# Patient Record
Sex: Male | Born: 1975 | Race: White | Hispanic: No | Marital: Married | Smoking: Former smoker
Health system: Southern US, Community
[De-identification: ages and names within clinical notes are randomized; demographics above are authoritative.]

## PROBLEM LIST (undated history)

## (undated) DIAGNOSIS — D649 Anemia, unspecified: Secondary | ICD-10-CM

## (undated) DIAGNOSIS — K5790 Diverticulosis of intestine, part unspecified, without perforation or abscess without bleeding: Secondary | ICD-10-CM

## (undated) DIAGNOSIS — F419 Anxiety disorder, unspecified: Secondary | ICD-10-CM

## (undated) DIAGNOSIS — K5090 Crohn's disease, unspecified, without complications: Secondary | ICD-10-CM

## (undated) DIAGNOSIS — R001 Bradycardia, unspecified: Secondary | ICD-10-CM

## (undated) HISTORY — PX: FLEXIBLE SIGMOIDOSCOPY: SHX1649

## (undated) HISTORY — PX: COLONOSCOPY: SHX174

## (undated) HISTORY — DX: Diverticulosis of intestine, part unspecified, without perforation or abscess without bleeding: K57.90

## (undated) HISTORY — PX: OTHER SURGICAL HISTORY: SHX169

## (undated) HISTORY — PX: MOUTH SURGERY: SHX715

## (undated) HISTORY — DX: Bradycardia, unspecified: R00.1

## (undated) HISTORY — DX: Anxiety disorder, unspecified: F41.9

---

## 1998-02-24 ENCOUNTER — Emergency Department (HOSPITAL_COMMUNITY): Admission: EM | Admit: 1998-02-24 | Discharge: 1998-02-24 | Payer: Self-pay | Admitting: Emergency Medicine

## 2002-06-25 ENCOUNTER — Encounter: Payer: Self-pay | Admitting: Emergency Medicine

## 2002-06-25 ENCOUNTER — Emergency Department (HOSPITAL_COMMUNITY): Admission: EM | Admit: 2002-06-25 | Discharge: 2002-06-25 | Payer: Self-pay | Admitting: Emergency Medicine

## 2003-05-16 ENCOUNTER — Emergency Department (HOSPITAL_COMMUNITY): Admission: EM | Admit: 2003-05-16 | Discharge: 2003-05-16 | Payer: Self-pay | Admitting: Emergency Medicine

## 2007-09-01 ENCOUNTER — Inpatient Hospital Stay (HOSPITAL_COMMUNITY): Admission: AD | Admit: 2007-09-01 | Discharge: 2007-09-06 | Payer: Self-pay | Admitting: Gastroenterology

## 2007-09-01 ENCOUNTER — Encounter: Admission: RE | Admit: 2007-09-01 | Discharge: 2007-09-01 | Payer: Self-pay | Admitting: Gastroenterology

## 2007-09-02 ENCOUNTER — Encounter (INDEPENDENT_AMBULATORY_CARE_PROVIDER_SITE_OTHER): Payer: Self-pay | Admitting: Gastroenterology

## 2009-02-18 ENCOUNTER — Emergency Department (HOSPITAL_COMMUNITY): Admission: EM | Admit: 2009-02-18 | Discharge: 2009-02-19 | Payer: Self-pay | Admitting: Emergency Medicine

## 2009-04-27 IMAGING — CR DG ABDOMEN 2V
2 series · 2 of 2 positions shown · non-contrast
Comparison: none

CLINICAL DATA: Abdominal pain.  Query obstruction.
ABDOMEN ? 2 VIEW:
Mild to moderate gastric distention of multiple small bowel loops.  Probably a small amount of gas in the right colon.  No distinct air fluid levels on the erect view.  No extraluminal gas.  Properitoneal fat stripes and psoas muscle margins are defined.  No evidence of a large stool burden.

[t abdomen supine]
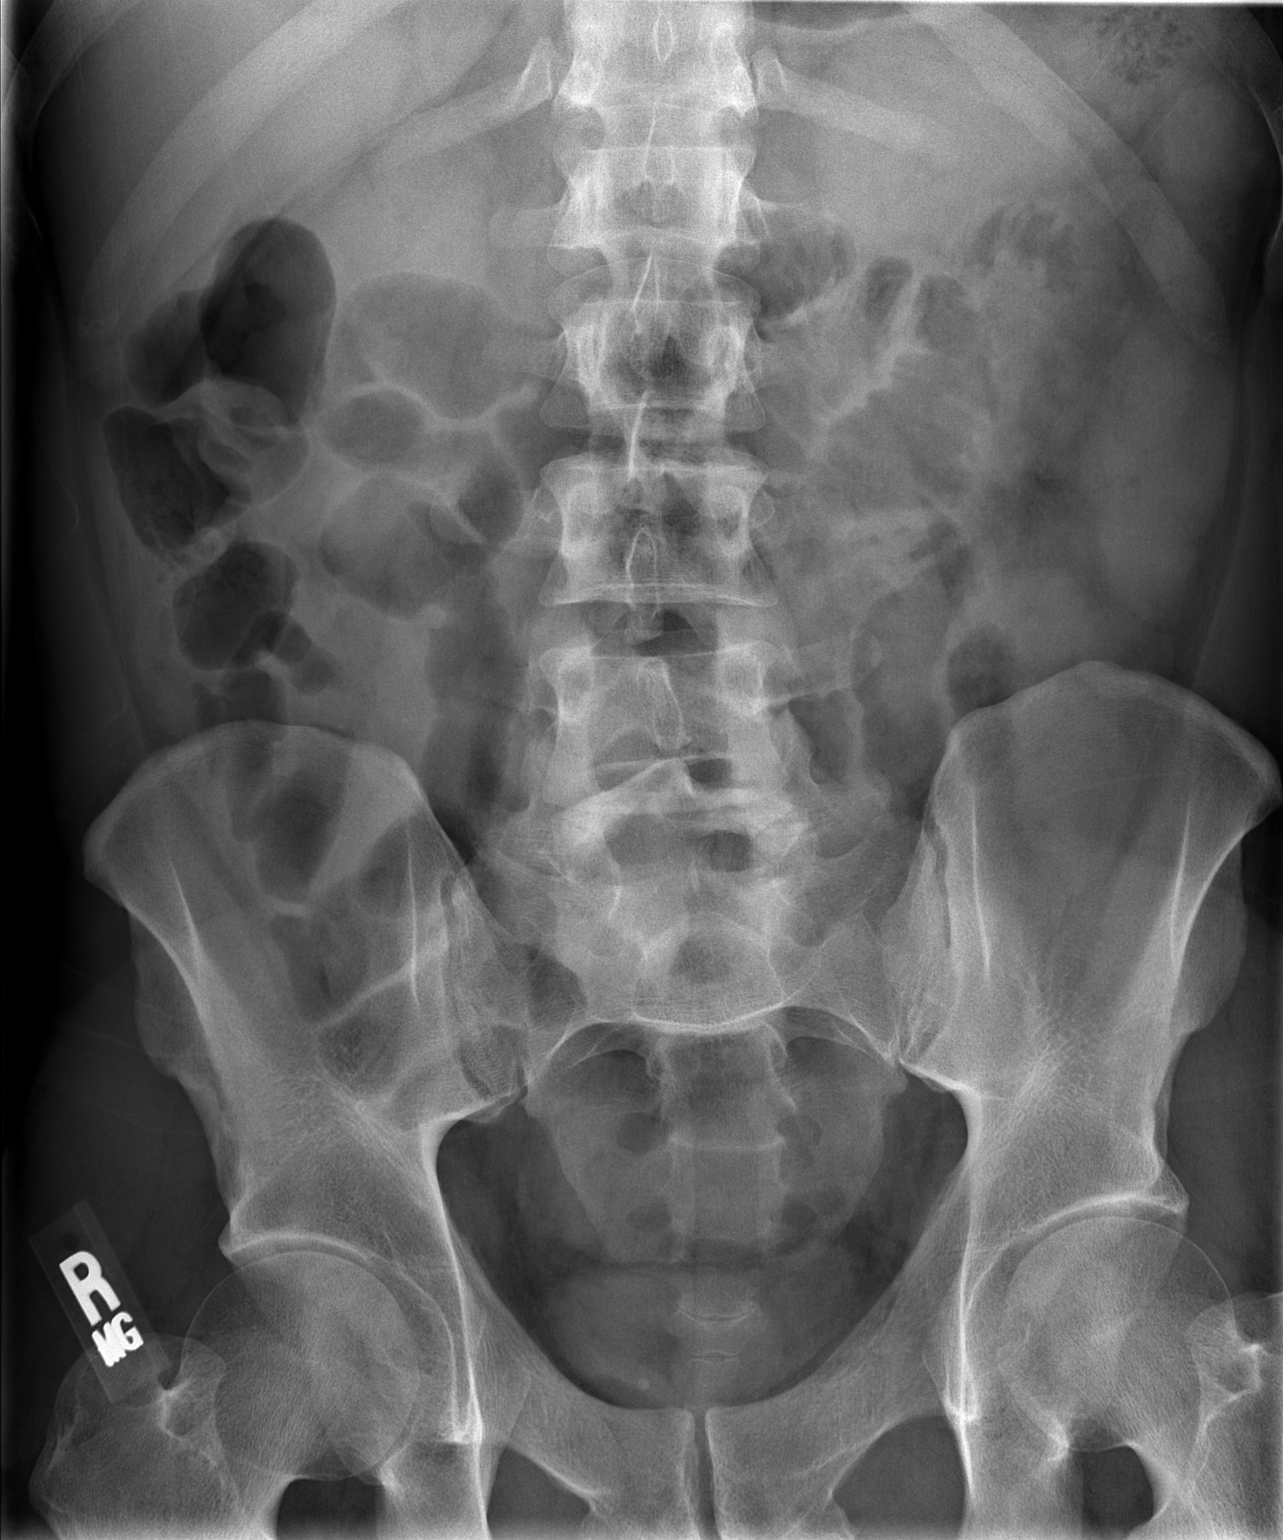

[w abdomen upright]
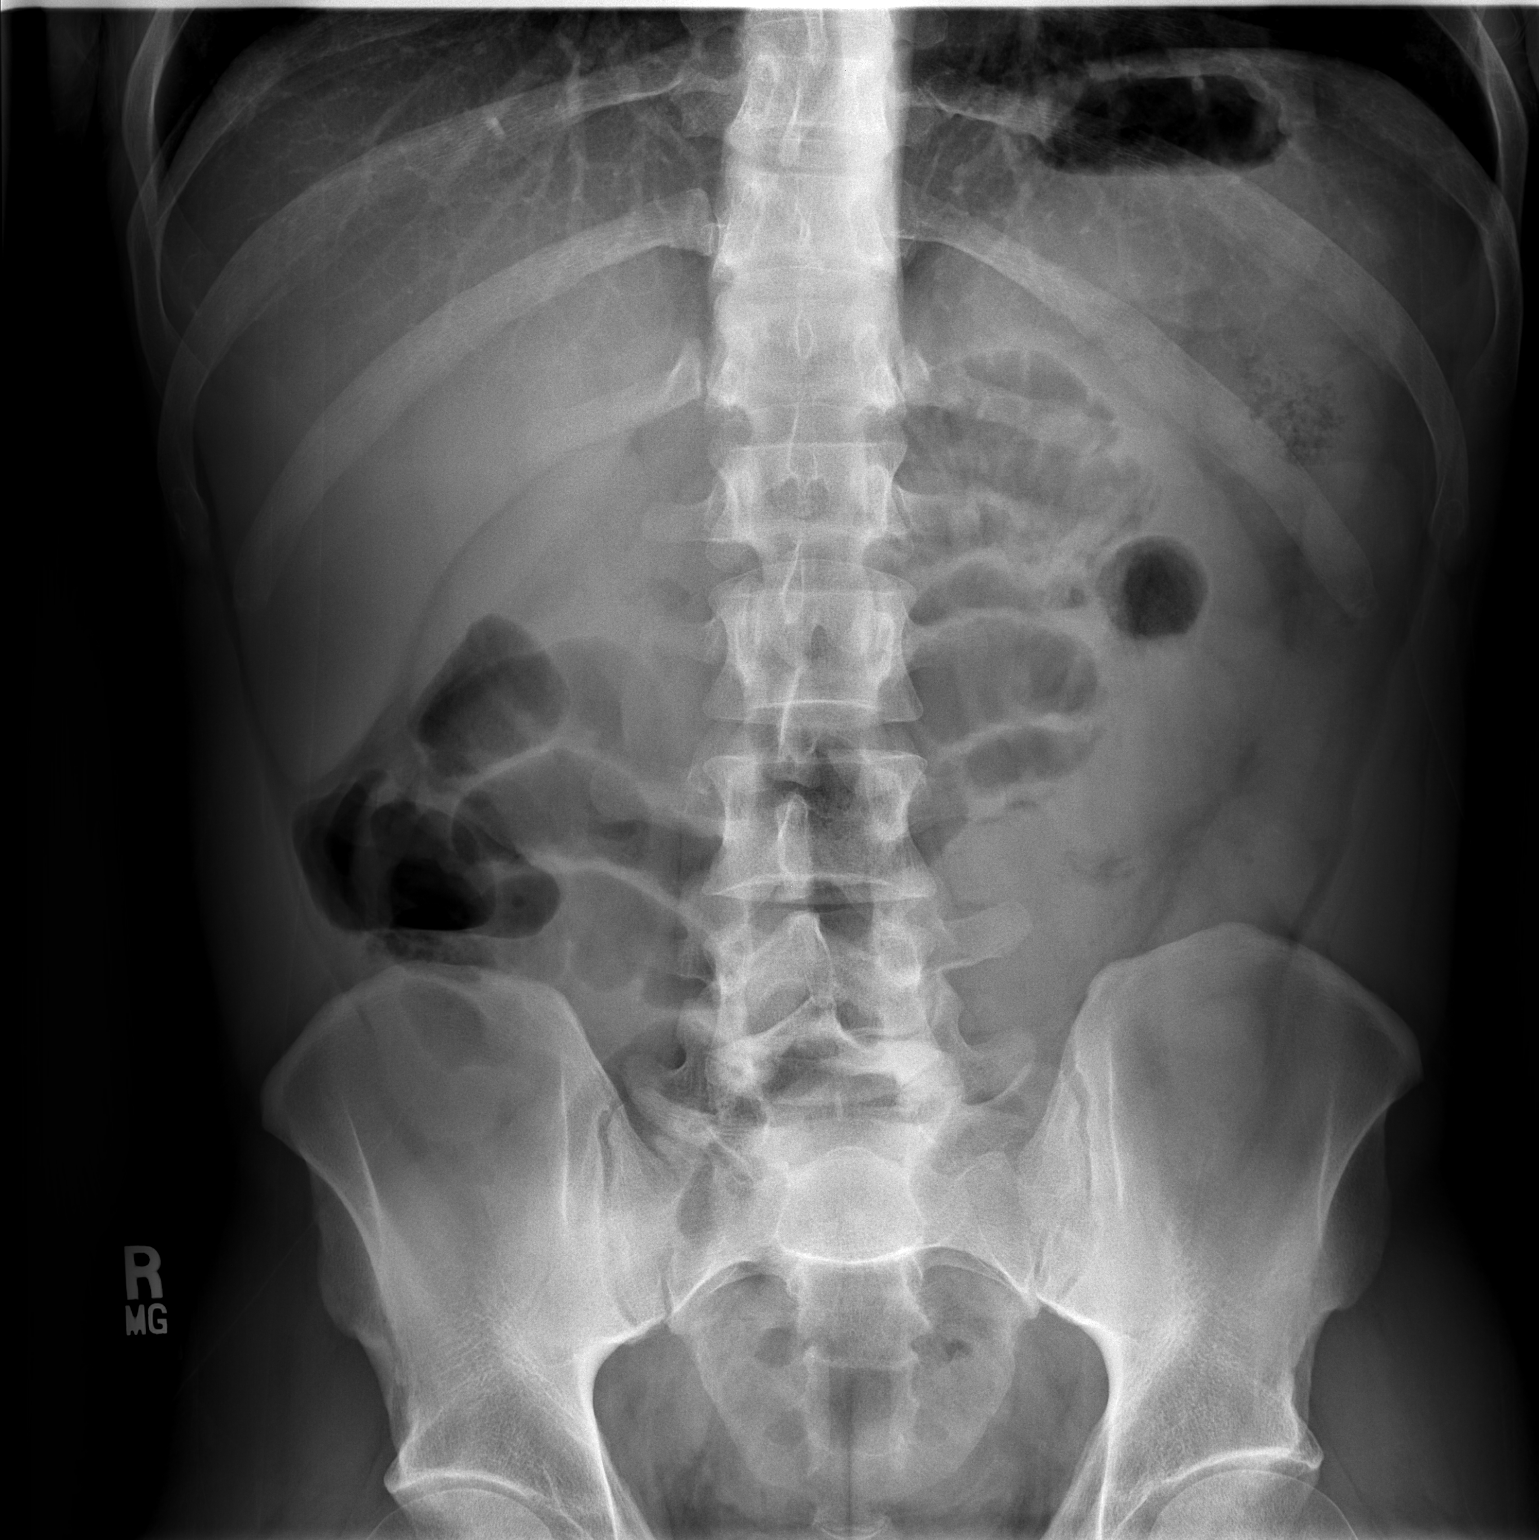

[2 of 2 positions shown; findings below may reference images not displayed]

IMPRESSION: Findings are most consistent with nonspecific ileus.

## 2009-08-17 ENCOUNTER — Ambulatory Visit: Payer: Self-pay | Admitting: Diagnostic Radiology

## 2009-08-17 ENCOUNTER — Emergency Department (HOSPITAL_BASED_OUTPATIENT_CLINIC_OR_DEPARTMENT_OTHER): Admission: EM | Admit: 2009-08-17 | Discharge: 2009-08-17 | Payer: Self-pay | Admitting: Family Medicine

## 2010-10-01 LAB — POCT CARDIAC MARKERS
Myoglobin, poc: 30.8 ng/mL (ref 12–200)
Troponin i, poc: <0.05 ng/mL (ref 0.00–0.09)

## 2010-10-01 LAB — COMPREHENSIVE METABOLIC PANEL
ALT: 10 U/L (ref 0–53)
AST: 16 U/L (ref 0–37)
Calcium: 9.6 mg/dL (ref 8.4–10.5)
Creatinine, Ser: 0.9 mg/dL (ref 0.4–1.5)
GFR calc Af Amer: >60 mL/min (ref >60)
GFR calc non Af Amer: >60 mL/min (ref >60)
Sodium: 141 mEq/L (ref 135–145)
Total Protein: 7.6 g/dL (ref 6.0–8.3)

## 2010-10-01 LAB — CBC
MCHC: 34.1 g/dL (ref 30.0–36.0)
MCV: 97.2 fL (ref 78.0–100.0)
RDW: 12.9 % (ref 11.5–15.5)

## 2010-10-01 LAB — DIFFERENTIAL
Eosinophils Absolute: 0.2 10*3/uL (ref 0.0–0.7)
Eosinophils Relative: 3 % (ref 0–5)
Lymphocytes Relative: 26 % (ref 12–46)
Lymphs Abs: 1.9 10*3/uL (ref 0.7–4.0)
Monocytes Relative: 6 % (ref 3–12)
Neutrophils Relative %: 63 % (ref 43–77)

## 2010-10-17 LAB — POCT I-STAT, CHEM 8
Creatinine, Ser: 0.9 mg/dL (ref 0.4–1.5)
Hemoglobin: 15.3 g/dL (ref 13.0–17.0)
Potassium: 3.7 mEq/L (ref 3.5–5.1)
Sodium: 139 mEq/L (ref 135–145)
TCO2: 24 mmol/L (ref 0–100)

## 2010-10-17 LAB — POCT CARDIAC MARKERS
CKMB, poc: <1 ng/mL — ABNORMAL LOW (ref 1.0–8.0)
Myoglobin, poc: 35.4 ng/mL (ref 12–200)

## 2010-11-24 NOTE — Op Note (Signed)
NAME:  Bradley Zhang, Bradley Zhang         ACCOUNT NO.:  [id number]   MEDICAL RECORD NO.:  [id number]          PATIENT TYPE:  INP   LOCATION:  5128                         FACILITY:  [facility]   PHYSICIAN:  Petra Kuba, M.D.    DATE OF BIRTH:  May 14, 1976   DATE OF PROCEDURE:  09/02/2007  DATE OF DISCHARGE:                               OPERATIVE REPORT   PROCEDURE:  Extended flexible sigmoidoscopy with biopsy.   INDICATIONS:  Patient with multiple GI complaints, abnormal CAT scan,  with sigmoid and hepatic flexure inflammation.  Consent was signed after  risks, benefits, methods, options thoroughly discussed with the patient  and his wife.   MEDICINES USED:  Fentanyl 100 mcg, Versed 9 mg.   PROCEDURE:  Rectal inspection is pertinent for small external  hemorrhoids.  Digital exam was negative.  Pediatric video colonoscope  was inserted.  Rectum, although unprepped, was just slightly edematous  with some mucus.  The sigmoid was moderately inflamed with edema,  erythema, and ulcers.  We were able to carefully advance through there.  The proximal descending seemed to be normal as did the distal  transverse.  We were easily able to advance the scope.  There was a  moderate amount of liquid stool which could be washed and suctioned.  In  the transverse, the underlying mucosa appeared normal.  However, in the  proximal transverse and possibly in the hepatic flexure, was increased  erythema, edema, and some ulcerations.  A few biopsies were obtained.  In trying to advance, the patient began experiencing pain.  There was  some looping, and we elected to withdraw.  We did not try any maneuvers  to advance further.  Some of the stool was washed and suctioned and sent  for customary stool studies, O&P, C dif, and C&S.  The scope was slowly  withdrawn, again confirming above findings with the normal splenic  flexure area, but the inflammation beginning probably in the distal and  descending.  Sigmoid  biopsies were obtained and put with the other  biopsies.  Once back in the rectum, anorectal pull-through and  retroflexion confirmed the above findings.  There were some small  internal hemorrhoids.  Air was suctioned and the scope removed.   The patient tolerated the procedure well.  There was no obvious  immediate complication.   ENDOSCOPIC DIAGNOSES:  1. Internal and external small hemorrhoids.  2. Rectum with very minimal edema and mucus.  3. Sigmoid with increased edema, erythema, and ulcers status post      biopsy.  4. Proximal descending to probably the midtransverse probably okay,      with a poor prep.  Lesions could have been missed.  Some stool      suctioned for appropriate studies.  5. Proximal transverse and possibly hepatic flexure increased      inflammation status post biopsy.  6. Stopped the procedure secondary to minimal pain and looping.  Did      not try to advance further.   PLAN:  PPD, clear liquids, await stool studies.  Send the pathology as  stated above.  Probably will need steroids soon,  and will go ahead and  begin Asacol and antibiotics with Cipro and Flagyl which should help  Crohn's as well while we wait on the studies to confirm.           ______________________________  Petra Kuba, M.D.     MEM/MEDQ  D:  09/02/2007  T:  09/03/2007  Job:  [id number]   cc:   Shirley Friar, MD  Joycelyn Rua, M.D.

## 2010-11-24 NOTE — Discharge Summary (Signed)
Bradley Zhang, Bradley Zhang         ACCOUNT NO.:  [id number]   MEDICAL RECORD NO.:  [id number]          PATIENT TYPE:  INP   LOCATION:  5154                         FACILITY:  [facility]   PHYSICIAN:  Bernette Redbird, M.D.   DATE OF BIRTH:  [birth year]   DATE OF ADMISSION:  09/01/2007  DATE OF DISCHARGE:  09/06/2007                               DISCHARGE SUMMARY   FINAL DIAGNOSES:  1. Nonspecific colitis, new onset, possibly Crohn's.  2. Abdominal pain and hematochezia secondary to #1.   CONSULTATIONS:  None.   COMPLICATIONS DURING ADMISSION:  None.   PROCEDURE:  1. CT of abdomen and pelvis.  2. Partial colonoscopy with biopsies.   LABORATORY DATA:  Admission white count 10,000, discharge white count  (on steroids) 17,100; admission hemoglobin 12.9, discharge hemoglobin  13.0; admission platelets 311,000, discharge platelets 412,000.  Admission chemistry panel within normal limits except albumin of 3.3.  Amylase and lipase normal.  Albumin prior to discharge was 2.7.  Discharge glucose 137, on steroids.   Biopsies of the colon showed nonspecific colitis with crypt abscesses  and architectural distortion, but no granulomas, felt to be compatible  with Crohn's disease.   HOSPITAL COURSE:  [name] was admitted from the office on February  20 following a several-week prodrome of difficulty moving his bowels.  He was having abdominal discomfort and distention and was sent to the  hospital, where a CT scan showed evidence of colitis, particularly in  the transverse colonic region and the sigmoid region.  There was also  some mesenteric adenitis.  Colonoscopy by Dr. Ewing Schlein the day after  admission showed a basically normal rectum, but more proximally,  significant edema and multiple focal ulcerations, raising the question  of Crohn's colitis.  Biopsies were compatible with that diagnosis.  A  PPD was placed and was negative at 48 hours as read by me.  The patient  was started on IV  steroids in addition to the Asacol that had already  been started.  His Cipro and Flagyl were discontinued.  His diet was  advanced.  He tolerated reasonable food intake, had resolution of his  abdominal pain, but continued to have some degree of tenesmus  symptomatology and small-volume liquid brown stools that were tinged  with blood.  However, it was felt that he was clinically stable for  discharge and continued convalescence as an outpatient while on ongoing  steroid therapy.   DISPOSITION:  The patient is discharged home on a low-residue diet with  instructions to call us in the event of severe symptomatic worsening.  He will follow up with me in the office in 5 days and with Dr. Bosie Clos,  his primary gastroenterologist, about a week thereafter.   DISCHARGE MEDICATIONS:  1. Asacol 400 mg four tablets t.i.d.  2. Prednisone 40 mg p.o. q.a.m.   Prescriptions for these medicines were provided.   CONDITION ON DISCHARGE:  Improved.           ______________________________  Bernette Redbird, M.D.     RB/MEDQ  D:  09/06/2007  T:  09/07/2007  Job:  [id number]   cc:   Joycelyn Rua,  M.D.  Shirley Friar, MD

## 2010-11-24 NOTE — H&P (Signed)
NAMEISAIH, BULGER         ACCOUNT NO.:  [id number]   MEDICAL RECORD NO.:  [id number]          PATIENT TYPE:  INP   LOCATION:  5128                         FACILITY:  [facility]   PHYSICIAN:  Shirley Friar, MDDATE OF BIRTH:  [birth year]   DATE OF ADMISSION:  09/01/2007  DATE OF DISCHARGE:                              HISTORY & PHYSICAL   ADMISSION DIAGNOSES:  1. Ileus.  2. Abdominal pain secondary to #1.  3. Nausea secondary to #1.   HISTORY OF PRESENT ILLNESS:  Mr. Agard was in his usual state of  health until approximately 3-and-a-half weeks ago when he started having  difficulty moving his bowels.  He had been moving his bowels daily until  that time when he lost all urge to move his bowels.  He took two enemas  without any relief at home as well as several laxatives without any  help.  He had a small formed bowel movement prior to seeing me on  August 28, 2007.  He also was having some headaches as well as lack of  energy and reported a 10-pound weight loss since the onset of his  constipation.  Several days prior to seeing me on August 28, 2007, he  started having bright red blood on the toilet tissue.  I gave him two  doses of magnesium citrate which helped with the constipation but on  August 30, 2007, he started having a low-grade temperature of 100.4.  He was told to call back if his fever persisted and also let his primary  care physician know.  On August 31, 2007, he says he started having  nausea, vomiting, low-grade temperature and dizziness and called our  office on the morning of September 01, 2007.  We sent him for a STAT x-  ray and this x-ray showed multiple distended loops of small bowel  without any air-fluid levels.  I had him come into the office for urgent  work-in and during my evaluation in the office he was lethargic,  complaining of abdominal discomfort, and was noted to have some  distention of his abdomen.   PAST MEDICAL HISTORY:  1.  History of anxiety.  2. Status post oral surgery.   MEDICINES:  Senokot up until August 28, 2007.   ALLERGIES:  No known drug allergies.   FAMILY HISTORY:  Negative for colon cancer or colon polyps.   SOCIAL HISTORY:  Occasional alcohol, denies cigarettes.   REVIEW OF SYSTEMS:  Negative except stated above.   PHYSICAL EXAMINATION:  Temperature 98.2, pulse 100, blood pressure  130/72, weight 221 pounds, down 5 pounds since February 16.  GENERAL:  Weak, lethargic, mild acute distress.  CARDIOVASCULAR:  Regular rate and rhythm.  CHEST:  Clear to auscultation bilaterally.  ABDOMEN:  Mild distention, periumbilical tenderness with guarding,  epigastric tenderness with guarding, positive bowel sounds.   IMPRESSION:  A 35 year old white male with a small-bowel ileus being  admitted for intravenous fluids, bowel rest and further evaluation.  He  will get abdominal CT scan to look for any underlying infection or  inflammation that could be contributing to this ileus.  Will put him  on  antiemetics and follow along closely.      Shirley Friar, MD  Electronically Signed     VCS/MEDQ  D:  09/01/2007  T:  09/02/2007  Job:  [id number]   cc:   Joycelyn Rua, M.D.

## 2011-01-18 ENCOUNTER — Other Ambulatory Visit: Payer: Self-pay | Admitting: Gastroenterology

## 2011-01-18 DIAGNOSIS — K5090 Crohn's disease, unspecified, without complications: Secondary | ICD-10-CM

## 2011-01-25 ENCOUNTER — Other Ambulatory Visit: Payer: Self-pay

## 2011-02-01 ENCOUNTER — Emergency Department (HOSPITAL_BASED_OUTPATIENT_CLINIC_OR_DEPARTMENT_OTHER)
Admission: EM | Admit: 2011-02-01 | Discharge: 2011-02-01 | Disposition: A | Discharge status: Home / Self Care | Payer: No Typology Code available for payment source | Attending: Emergency Medicine | Admitting: Emergency Medicine

## 2011-02-01 ENCOUNTER — Emergency Department (INDEPENDENT_AMBULATORY_CARE_PROVIDER_SITE_OTHER): Payer: No Typology Code available for payment source

## 2011-02-01 DIAGNOSIS — Y92410 Unspecified street and highway as the place of occurrence of the external cause: Secondary | ICD-10-CM | POA: Insufficient documentation

## 2011-02-01 DIAGNOSIS — R51 Headache: Secondary | ICD-10-CM | POA: Insufficient documentation

## 2011-02-01 DIAGNOSIS — S139XXA Sprain of joints and ligaments of unspecified parts of neck, initial encounter: Secondary | ICD-10-CM | POA: Insufficient documentation

## 2011-02-01 DIAGNOSIS — K5090 Crohn's disease, unspecified, without complications: Secondary | ICD-10-CM | POA: Insufficient documentation

## 2011-02-01 DIAGNOSIS — S161XXA Strain of muscle, fascia and tendon at neck level, initial encounter: Secondary | ICD-10-CM

## 2011-02-01 DIAGNOSIS — S0990XA Unspecified injury of head, initial encounter: Secondary | ICD-10-CM

## 2011-02-01 HISTORY — DX: Crohn's disease, unspecified, without complications: K50.90

## 2011-02-01 HISTORY — DX: Anemia, unspecified: D64.9

## 2011-02-01 MED ORDER — MORPHINE SULFATE 4 MG/ML IJ SOLN
4.0000 mg | Freq: Once | INTRAMUSCULAR | Status: AC
  Administered 2011-02-01: 4 mg via INTRAMUSCULAR
  Filled 2011-02-01: qty 1

## 2011-02-01 MED ORDER — TRAMADOL HCL 50 MG PO TABS: 50.0000 mg | ORAL_TABLET | Freq: Four times a day (QID) | ORAL | Status: AC | PRN

## 2011-02-01 MED ORDER — CYCLOBENZAPRINE HCL 10 MG PO TABS: 10.0000 mg | ORAL_TABLET | Freq: Two times a day (BID) | ORAL | Status: AC | PRN

## 2011-02-01 NOTE — ED Notes (Signed)
MD at bedside. 
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
## 2011-04-05 LAB — CBC
HCT: 37.6 — ABNORMAL LOW
HCT: 38.1 — ABNORMAL LOW
HCT: 38.9 — ABNORMAL LOW
HCT: 39.1
HCT: 39.9
Hemoglobin: 12.5 — ABNORMAL LOW
Hemoglobin: 12.8 — ABNORMAL LOW
Hemoglobin: 12.9 — ABNORMAL LOW
Hemoglobin: 13
Hemoglobin: 13.2
MCHC: 33.2
MCHC: 33.7
MCV: 95.6
MCV: 96.2
MCV: 96.6
Platelets: 311
Platelets: 332
Platelets: 381
Platelets: 412 — ABNORMAL HIGH
RBC: 3.98 — ABNORMAL LOW
RBC: 4.04 — ABNORMAL LOW
RBC: 4.12 — ABNORMAL LOW
RDW: 12.9
RDW: 13.1
WBC: 10
WBC: 10.6 — ABNORMAL HIGH
WBC: 11.8 — ABNORMAL HIGH
WBC: 14.6 — ABNORMAL HIGH
WBC: 17.1 — ABNORMAL HIGH

## 2011-04-05 LAB — COMPREHENSIVE METABOLIC PANEL
ALT: 9
AST: 13
AST: 9
Albumin: 2.7 — ABNORMAL LOW
Albumin: 3.3 — ABNORMAL LOW
Alkaline Phosphatase: 65
BUN: 2 — ABNORMAL LOW
BUN: 4 — ABNORMAL LOW
CO2: 26
Calcium: 8.6
Calcium: 8.8
Chloride: 101
Chloride: 103
Creatinine, Ser: 0.98
Creatinine, Ser: 1.02
GFR calc Af Amer: >60
GFR calc Af Amer: >60
GFR calc non Af Amer: >60
Glucose, Bld: 107 — ABNORMAL HIGH
Potassium: 3.5
Sodium: 137
Total Bilirubin: 0.2 — ABNORMAL LOW
Total Bilirubin: 0.6
Total Protein: 5.9 — ABNORMAL LOW
Total Protein: 6.5

## 2011-04-05 LAB — DIFFERENTIAL
Basophils Absolute: 0
Eosinophils Relative: 3
Lymphocytes Relative: 12
Lymphs Abs: 1.2
Monocytes Absolute: 1.8 — ABNORMAL HIGH
Neutro Abs: 6.7

## 2011-04-05 LAB — CLOSTRIDIUM DIFFICILE EIA: C difficile Toxins A+B, EIA: NEGATIVE

## 2011-04-05 LAB — BASIC METABOLIC PANEL
BUN: 8
Calcium: 8.8
Chloride: 102
GFR calc non Af Amer: >60
GFR calc non Af Amer: >60
Glucose, Bld: 105 — ABNORMAL HIGH
Potassium: 3.7
Potassium: 4
Sodium: 133 — ABNORMAL LOW
Sodium: 136

## 2011-04-05 LAB — GIARDIA/CRYPTOSPORIDIUM SCREEN(EIA)
Cryptosporidium Screen (EIA): NEGATIVE
Giardia Screen - EIA: NEGATIVE

## 2011-04-05 LAB — STOOL CULTURE

## 2011-08-04 ENCOUNTER — Encounter (HOSPITAL_COMMUNITY): Payer: Self-pay | Admitting: Emergency Medicine

## 2011-08-04 ENCOUNTER — Emergency Department (HOSPITAL_COMMUNITY): Payer: Managed Care, Other (non HMO)

## 2011-08-04 ENCOUNTER — Emergency Department (HOSPITAL_COMMUNITY)
Admission: EM | Admit: 2011-08-04 | Discharge: 2011-08-04 | Disposition: A | Discharge status: Home / Self Care | Payer: Managed Care, Other (non HMO) | Attending: Emergency Medicine | Admitting: Emergency Medicine

## 2011-08-04 DIAGNOSIS — R231 Pallor: Secondary | ICD-10-CM | POA: Insufficient documentation

## 2011-08-04 DIAGNOSIS — R5381 Other malaise: Secondary | ICD-10-CM | POA: Insufficient documentation

## 2011-08-04 DIAGNOSIS — R42 Dizziness and giddiness: Secondary | ICD-10-CM | POA: Insufficient documentation

## 2011-08-04 DIAGNOSIS — R5383 Other fatigue: Secondary | ICD-10-CM | POA: Insufficient documentation

## 2011-08-04 DIAGNOSIS — K5090 Crohn's disease, unspecified, without complications: Secondary | ICD-10-CM | POA: Insufficient documentation

## 2011-08-04 DIAGNOSIS — R6883 Chills (without fever): Secondary | ICD-10-CM | POA: Insufficient documentation

## 2011-08-04 DIAGNOSIS — R109 Unspecified abdominal pain: Secondary | ICD-10-CM | POA: Insufficient documentation

## 2011-08-04 LAB — DIFFERENTIAL
Basophils Absolute: 0 K/uL (ref 0.0–0.1)
Basophils Relative: 1 % (ref 0–1)
Eosinophils Absolute: 0.3 K/uL (ref 0.0–0.7)
Eosinophils Relative: 4 % (ref 0–5)
Lymphocytes Relative: 25 % (ref 12–46)
Lymphs Abs: 2.1 10*3/uL (ref 0.7–4.0)
Monocytes Absolute: 0.8 K/uL (ref 0.1–1.0)
Monocytes Relative: 9 % (ref 3–12)
Neutro Abs: 5.2 10*3/uL (ref 1.7–7.7)
Neutrophils Relative %: 62 % (ref 43–77)

## 2011-08-04 LAB — CBC
HCT: 45.3 % (ref 39.0–52.0)
Hemoglobin: 15 g/dL (ref 13.0–17.0)
MCH: 31.8 pg (ref 26.0–34.0)
MCHC: 33.1 g/dL (ref 30.0–36.0)
MCV: 96.2 fL (ref 78.0–100.0)
Platelets: 257 10*3/uL (ref 150–400)
RBC: 4.71 MIL/uL (ref 4.22–5.81)
RDW: 13.2 % (ref 11.5–15.5)
WBC: 8.4 10*3/uL (ref 4.0–10.5)

## 2011-08-04 LAB — URINALYSIS, ROUTINE W REFLEX MICROSCOPIC
Bilirubin Urine: NEGATIVE
Glucose, UA: NEGATIVE mg/dL
Hgb urine dipstick: NEGATIVE
Ketones, ur: NEGATIVE mg/dL
Leukocytes, UA: NEGATIVE
Nitrite: NEGATIVE
Protein, ur: NEGATIVE mg/dL
Specific Gravity, Urine: 1.008 (ref 1.005–1.030)
Urobilinogen, UA: 0.2 mg/dL (ref 0.0–1.0)
pH: 6 (ref 5.0–8.0)

## 2011-08-04 LAB — COMPREHENSIVE METABOLIC PANEL
ALT: 12 U/L (ref 0–53)
Alkaline Phosphatase: 74 U/L (ref 39–117)
BUN: 12 mg/dL (ref 6–23)
CO2: 26 mEq/L (ref 19–32)
Chloride: 105 mEq/L (ref 96–112)
GFR calc Af Amer: >90 mL/min (ref >90)
Glucose, Bld: 84 mg/dL (ref 70–99)
Potassium: 4.1 mEq/L (ref 3.5–5.1)
Sodium: 140 mEq/L (ref 135–145)
Total Bilirubin: 0.3 mg/dL (ref 0.3–1.2)

## 2011-08-04 LAB — COMPREHENSIVE METABOLIC PANEL WITH GFR
AST: 14 U/L (ref 0–37)
Albumin: 4.2 g/dL (ref 3.5–5.2)
Calcium: 9.5 mg/dL (ref 8.4–10.5)
Creatinine, Ser: 0.96 mg/dL (ref 0.50–1.35)
GFR calc non Af Amer: >90 mL/min (ref >90)
Total Protein: 7.4 g/dL (ref 6.0–8.3)

## 2011-08-04 LAB — LIPASE, BLOOD: Lipase: 30 U/L (ref 11–59)

## 2011-08-04 MED ORDER — MORPHINE SULFATE 4 MG/ML IJ SOLN
4.0000 mg | Freq: Once | INTRAMUSCULAR | Status: AC
  Administered 2011-08-04: 4 mg via INTRAVENOUS
  Filled 2011-08-04: qty 1

## 2011-08-04 MED ORDER — SODIUM CHLORIDE 0.9 % IV SOLN
Freq: Once | INTRAVENOUS | Status: AC
  Administered 2011-08-04: 15:00:00 via INTRAVENOUS

## 2011-08-04 MED ORDER — HYDROCODONE-ACETAMINOPHEN 5-325 MG PO TABS: 1.0000 | ORAL_TABLET | Freq: Four times a day (QID) | ORAL | Status: AC | PRN

## 2011-08-04 MED ORDER — PROMETHAZINE HCL 25 MG PO TABS: 25.0000 mg | ORAL_TABLET | Freq: Four times a day (QID) | ORAL | Status: AC | PRN

## 2011-08-04 MED ORDER — IOHEXOL 300 MG/ML  SOLN
100.0000 mL | Freq: Once | INTRAMUSCULAR | Status: AC | PRN
  Administered 2011-08-04: 100 mL via INTRAVENOUS

## 2011-08-04 NOTE — ED Provider Notes (Signed)
4:15 PM Patient is in CDU holding for CT abd/pelvis.  Patient reports he is fatigued, has had pain for 2 weeks.  Declines pain and nausea medication at this time.  On exam, pt is A&Ox4, NAD, RRR, CTAB, no CVA tenderness, abd: NABS, nondistended, soft, nontender, no guarding, no rebound. Plan is for CT abd/pelvis with contrast, then call to Dr Bosie Clos to discuss plan/follow up.   6:48 PM Discussed patient and all results with Dr Matthias Hughs.  Dr Matthias Hughs advises following up with Dr Bosie Clos on Friday as planned and d/c home now with pain medication.  States we do not need to do any further testing here and recommends against prednisone at this time.  Discussed all results with patient and significant other.  Plan is for d/c home.  Patient and spouse agree with plan, verbalize understanding.    Dillard Cannon [place], [place] 08/04/11 2043

## 2011-08-04 NOTE — ED Notes (Signed)
Returned from ct 

## 2011-08-04 NOTE — ED Notes (Signed)
Sharp pain in abd and now  In rt flank  And back feels tired has hx of chrons

## 2011-08-04 NOTE — ED Notes (Signed)
C/o bilateral flank pain today. Denies urinary sx, n/v/d. Pt hx of crohns and states having generalized abd pain and bloating x 2 weeks but attributed discomfort to pre-existing disease. States pain became unbearable today prompting pt to come to dept. Pt's pain currently controlled with analgesia. Awaiting ct scan.

## 2011-08-04 NOTE — ED Provider Notes (Signed)
History     CSN: [id number]  Arrival date & time 08/04/11  1307   First MD Initiated Contact with Patient 08/04/11 1415      Chief Complaint  Patient presents with  . Flank Pain    (Consider location/radiation/quality/duration/timing/severity/associated sxs/prior treatment) HPI Comments: Patient has had 2-3 weeks of intermittent lower abdominal and flank discomfort which she describes as intermittent as well as waxing and waning periods. He reports that he feels some sharp pains that come in waves. He reports that this does not feel like prior Crohn's episodes. He is currently on medications for his Crohn's including Asacol and folic acid. Patient has never previously had any abdominal surgeries, no other significant past medical history. He denies chest pain, fevers chills. His spouse reports that patient has complained of feeling weak and lightheaded as though he were to pass out. He also had complained of some chills to her although the patient initially told me that he had not. Patient denies black or bloody stools. He did speak to the GI office today and was told to come here to the emergency department. He is supposed to have routine blood test done at the office on Friday.  Patient is a 36 y.o. male presenting with flank pain. The history is provided by the patient, the spouse and a relative.  Flank Pain Associated symptoms include abdominal pain.    Past Medical History  Diagnosis Date  . Crohn disease   . Anemia     History reviewed. No pertinent past surgical history.  No family history on file.  History  Substance Use Topics  . Smoking status: Never Smoker   . Smokeless tobacco: Not on file  . Alcohol Use: 0.6 oz/week    1 Cans of beer per week     weekly      Review of Systems  Constitutional: Positive for chills. Negative for fever.  Gastrointestinal: Positive for abdominal pain. Negative for nausea, vomiting and diarrhea.  Genitourinary: Positive for flank  pain. Negative for dysuria.  Musculoskeletal: Negative for back pain and joint swelling.  Skin: Positive for pallor. Negative for wound.  Neurological: Positive for light-headedness.  All other systems reviewed and are negative.    Allergies  Nsaids  Home Medications   Current Outpatient Rx  Name Route Sig Dispense Refill  . FOLIC ACID 1 MG PO TABS Oral Take 1 mg by mouth daily.      Marland Kitchen MESALAMINE 400 MG PO TBEC Oral Take 800 mg by mouth 2 (two) times daily before a meal.        BP 123/76  Pulse 87  Temp(Src) 97.4 F (36.3 C) (Oral)  Resp 18  SpO2 98%  Physical Exam  Nursing note and vitals reviewed. Constitutional: He is oriented to person, place, and time. He appears well-developed and well-nourished.  HENT:  Head: Normocephalic and atraumatic.  Eyes: Pupils are equal, round, and reactive to light. No scleral icterus.  Neck: Normal range of motion. Neck supple.  Cardiovascular: Normal rate and regular rhythm.   Pulmonary/Chest: Effort normal and breath sounds normal.  Abdominal: Soft. He exhibits no distension. There is no tenderness. There is no rebound and no guarding.  Musculoskeletal: Normal range of motion. He exhibits no edema.  Neurological: He is oriented to person, place, and time.  Skin: Skin is warm and dry. No rash noted.    ED Course  Procedures (including critical care time)   Labs Reviewed  URINALYSIS, ROUTINE W REFLEX MICROSCOPIC  CBC  DIFFERENTIAL  COMPREHENSIVE METABOLIC PANEL  LIPASE, BLOOD   No results found.   No diagnosis found.  Room air saturation is 98% which is normal  MDM  Patient's lightheadedness and feeling dizzy is likely secondary to his overall clinical condition. Patient's pain has been present for a chronic period of time meaning the last 2-3 weeks. I do not feel that this is an acute condition such as appendicitis or diverticulitis given the length of time and nonfocal to the of his abdominal pain. He is actually not  very tender on my abdominal examination at this time. He certainly does not have a surgical abdomen. He is afebrile with stable, normal vital signs. My plan is to obtain baseline laboratory tests and obtain a CT scan of his abdomen his pelvis, and then his care can be discussed with his primary care gastroenterologist Dr. Bosie Clos for disposition and treatment plan. IV fluids and IV morphine ordered at this time.      Pt is placed in CDU for holding for CT scan and discuss with Dr. Bosie Clos.  Discussed with [facility] and Dr. Bebe Shaggy.  Gavin Pound. Oletta Lamas, MD 08/05/11 [phone]

## 2011-08-04 NOTE — ED Notes (Signed)
C/o increased pain. PA aware, order given.

## 2011-08-04 NOTE — ED Notes (Signed)
Patient transported to CT 

## 2011-08-05 NOTE — ED Provider Notes (Signed)
Medical screening examination/treatment/procedure(s) were conducted as a shared visit with non-physician practitioner(s) and myself.  I personally evaluated the patient during the encounter Please see my prior note for full H&P and ED course up until CDU placement.   Violette Morneault Y.   Gavin Pound. Oletta Lamas, MD 08/05/11 [phone]

## 2011-12-31 ENCOUNTER — Emergency Department (HOSPITAL_BASED_OUTPATIENT_CLINIC_OR_DEPARTMENT_OTHER)
Admission: EM | Admit: 2011-12-31 | Discharge: 2012-01-01 | Disposition: A | Discharge status: Home / Self Care | Payer: Managed Care, Other (non HMO) | Attending: Emergency Medicine | Admitting: Emergency Medicine

## 2011-12-31 ENCOUNTER — Encounter (HOSPITAL_BASED_OUTPATIENT_CLINIC_OR_DEPARTMENT_OTHER): Payer: Self-pay | Admitting: *Deleted

## 2011-12-31 DIAGNOSIS — S058X9A Other injuries of unspecified eye and orbit, initial encounter: Secondary | ICD-10-CM | POA: Insufficient documentation

## 2011-12-31 DIAGNOSIS — T1590XA Foreign body on external eye, part unspecified, unspecified eye, initial encounter: Secondary | ICD-10-CM | POA: Insufficient documentation

## 2011-12-31 DIAGNOSIS — K5090 Crohn's disease, unspecified, without complications: Secondary | ICD-10-CM | POA: Insufficient documentation

## 2011-12-31 DIAGNOSIS — S0500XA Injury of conjunctiva and corneal abrasion without foreign body, unspecified eye, initial encounter: Secondary | ICD-10-CM

## 2011-12-31 DIAGNOSIS — Y93E9 Activity, other interior property and clothing maintenance: Secondary | ICD-10-CM | POA: Insufficient documentation

## 2011-12-31 DIAGNOSIS — D649 Anemia, unspecified: Secondary | ICD-10-CM | POA: Insufficient documentation

## 2011-12-31 DIAGNOSIS — Y998 Other external cause status: Secondary | ICD-10-CM | POA: Insufficient documentation

## 2011-12-31 MED ORDER — FLUORESCEIN SODIUM 1 MG OP STRP
ORAL_STRIP | OPHTHALMIC | Status: AC
  Administered 2011-12-31: 1 via OPHTHALMIC
  Filled 2011-12-31: qty 1

## 2011-12-31 MED ORDER — TETANUS-DIPHTH-ACELL PERTUSSIS 5-2.5-18.5 LF-MCG/0.5 IM SUSP
0.5000 mL | Freq: Once | INTRAMUSCULAR | Status: AC
  Administered 2012-01-01: 0.5 mL via INTRAMUSCULAR
  Filled 2011-12-31: qty 0.5

## 2011-12-31 MED ORDER — TETRACAINE HCL 0.5 % OP SOLN
OPHTHALMIC | Status: AC
  Filled 2011-12-31: qty 2

## 2011-12-31 MED ORDER — FLUORESCEIN SODIUM 1 MG OP STRP
1.0000 | ORAL_STRIP | Freq: Once | OPHTHALMIC | Status: AC
  Administered 2011-12-31: 1 via OPHTHALMIC

## 2011-12-31 MED ORDER — TETRACAINE HCL 0.5 % OP SOLN
2.0000 [drp] | Freq: Once | OPHTHALMIC | Status: AC
  Administered 2011-12-31: 2 [drp] via OPHTHALMIC

## 2011-12-31 NOTE — ED Provider Notes (Signed)
History     CSN: [id number]  Arrival date & time 12/31/11  2238   First MD Initiated Contact with Patient 12/31/11 2341      Chief Complaint  Patient presents with  . Eye Pain    (Consider location/radiation/quality/duration/timing/severity/associated sxs/prior treatment) Patient is a 36 y.o. male presenting with eye pain. The history is provided by the patient. No language interpreter was used.  Eye Pain This is a new problem. The current episode started 2 days ago. The problem occurs constantly. The problem has been gradually worsening. Pertinent negatives include no chest pain, no abdominal pain, no headaches and no shortness of breath. Nothing aggravates the symptoms. Nothing relieves the symptoms. He has tried nothing for the symptoms. The treatment provided no relief.  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 Crohn disease   . Anemia     History reviewed. No pertinent past surgical history.  No family history on file.  History  Substance Use Topics  . Smoking status: Never Smoker   . Smokeless tobacco: Not on file  . Alcohol Use: 0.6 oz/week    1 Cans of beer per week     weekly      Review of Systems  Eyes: Positive for photophobia, pain and redness.  Respiratory: Negative for shortness of breath.   Cardiovascular: Negative for chest pain.  Gastrointestinal: Negative for abdominal pain.  Neurological: Negative for headaches.  All other systems reviewed and are negative.    Allergies  Nsaids  Home Medications   Current Outpatient Rx  Name Route Sig Dispense Refill  . FOLIC ACID 1 MG PO TABS Oral Take 1 mg by mouth daily.      Marland Kitchen MESALAMINE 400 MG PO TBEC Oral Take 800 mg by mouth 2 (two) times daily before a meal.        BP 129/72  Pulse 72  Temp 97.5 F (36.4 C) (Oral)  Resp 20  Ht 6\' 1"  (1.854 m)  Wt 220 lb (99.791 kg)  BMI  29.03 kg/m2  SpO2 100%  Physical Exam  Constitutional: He is oriented to person, place, and time. He appears well-developed and well-nourished.  HENT:  Head: Normocephalic and atraumatic.  Mouth/Throat: Oropharynx is clear and moist.  Eyes: EOM are normal. Pupils are equal, round, and reactive to light. Right eye exhibits no discharge and no exudate. No foreign body present in the right eye. Left eye exhibits no discharge and no exudate. No foreign body present in the left eye. Left conjunctiva is injected. Left conjunctiva has no hemorrhage. Right eye exhibits normal extraocular motion. Left eye exhibits normal extraocular motion.  Slit lamp exam:      The left eye shows corneal abrasion. The left eye shows no corneal ulcer, no foreign body and no hyphema.    Neck: Normal range of motion. Neck supple.  Cardiovascular: Normal rate and regular rhythm.   Pulmonary/Chest: Effort normal and breath sounds normal.  Abdominal: Soft. Bowel sounds are normal.  Musculoskeletal: Normal range of motion.  Neurological: He is alert and oriented to person, place, and time.  Skin: Skin is warm and dry.  Psychiatric: He has a normal mood and affect.    ED Course  Procedures (including critical care time)  Labs Reviewed - No data to display No results found.   No diagnosis found.    MDM  Tetanus  updated.  Patient will need to follow up with optho in am, call at 8 am for same day appointment.  No contact lens wearing until cleared by optho.  Use all antibiotics as directed.  Patient verbalizes understanding and agrees to follow up        Terique Kawabata Smitty Cords, MD 01/01/12 4098

## 2011-12-31 NOTE — ED Notes (Signed)
Pressure washing yesterday and started having pain in his left eye. Felt like he got something in it. He has been flushing it with water and using ice packs. Wears contacts but took the left one out.

## 2011-12-31 NOTE — ED Notes (Signed)
Wife demanding pt to have pain medication right now. Explained to pt and family that pt would have to be evaluated by a physician in order to receive pain medication. Explained to pt and wife there are no available rooms for evaluation at this time and pt would be seen as soon as possible.

## 2011-12-31 NOTE — ED Notes (Signed)
Pt states immediate decrease in pain with tetracaine but "its wearing off". Explained to pt that medication could not be administered again at this time.

## 2012-01-01 MED ORDER — TRAMADOL HCL 50 MG PO TABS: 50.0000 mg | ORAL_TABLET | Freq: Four times a day (QID) | ORAL | Status: AC | PRN

## 2012-01-01 MED ORDER — MOXIFLOXACIN HCL 0.5 % OP SOLN: 1.0000 [drp] | Freq: Four times a day (QID) | OPHTHALMIC | Status: AC

## 2012-01-01 NOTE — Discharge Instructions (Signed)
Driving and Equipment Restrictions Some medical problems make it dangerous to drive, ride a bike, or use machines. Some of these problems are:  A hard blow to the head (concussion).   Passing out (fainting).   Twitching and shaking (seizures).   Low blood sugar.   Taking medicine to help you relax (sedatives).   Taking pain medicines.   Wearing an eye patch.   Wearing splints. This can make it hard to use parts of your body that you need to drive safely.  HOME CARE   Do not drive until your doctor says it is okay.   Do not use machines until your doctor says it is okay.  You may need a form signed by your doctor (medical release) before you can drive again. You may also need this form before you do other tasks where you need to be fully alert. MAKE SURE YOU:  Understand these instructions.   Will watch your condition.   Will get help right away if you are not doing well or get worse.  Document Released: 08/05/2004 Document Revised: 06/17/2011 Document Reviewed: 11/05/2009 [facility] Patient Information 2012 [place], [place].Corneal Abrasion The cornea is the clear covering at the front and center of the eye. It is a thin tissue made up of layers. The top layer is the most sensitive layer. A corneal abrasion happens if this layer is scratched or an injury causes it to come off.  HOME CARE  You may be given drops or a medicated cream. Use the medicine as told by your doctor.   A pressure patch may be put over the eye. If this is done, follow your doctor's instructions for when to remove the patch. Do not drive or use machines while the eye patch is on. Judging distances is hard to do with a patch on.   See your doctor for a follow-up exam if you are told to do so.  GET HELP RIGHT AWAY IF:   The pain is getting worse or is very bad.   The eye is very sensitive to light.   Any liquid comes out of the injured eye after treatment.   Your vision suddenly gets worse.   You  have a sudden loss of vision or blindness.  MAKE SURE YOU:   Understand these instructions.   Will watch your condition.   Will get help right away if you are not doing well or get worse.  Document Released: 12/15/2007 Document Revised: 06/17/2011 Document Reviewed: 12/15/2007 [facility] Patient Information 2012 [place], [place].

## 2012-02-26 ENCOUNTER — Emergency Department (HOSPITAL_BASED_OUTPATIENT_CLINIC_OR_DEPARTMENT_OTHER): Payer: Managed Care, Other (non HMO)

## 2012-02-26 ENCOUNTER — Emergency Department (HOSPITAL_BASED_OUTPATIENT_CLINIC_OR_DEPARTMENT_OTHER)
Admission: EM | Admit: 2012-02-26 | Discharge: 2012-02-27 | Disposition: A | Discharge status: Home / Self Care | Payer: Managed Care, Other (non HMO) | Attending: Emergency Medicine | Admitting: Emergency Medicine

## 2012-02-26 ENCOUNTER — Encounter (HOSPITAL_BASED_OUTPATIENT_CLINIC_OR_DEPARTMENT_OTHER): Payer: Self-pay | Admitting: *Deleted

## 2012-02-26 DIAGNOSIS — R209 Unspecified disturbances of skin sensation: Secondary | ICD-10-CM | POA: Insufficient documentation

## 2012-02-26 DIAGNOSIS — R202 Paresthesia of skin: Secondary | ICD-10-CM

## 2012-02-26 DIAGNOSIS — R51 Headache: Secondary | ICD-10-CM

## 2012-02-26 DIAGNOSIS — S90569A Insect bite (nonvenomous), unspecified ankle, initial encounter: Secondary | ICD-10-CM | POA: Insufficient documentation

## 2012-02-26 DIAGNOSIS — K5090 Crohn's disease, unspecified, without complications: Secondary | ICD-10-CM | POA: Insufficient documentation

## 2012-02-26 DIAGNOSIS — W57XXXA Bitten or stung by nonvenomous insect and other nonvenomous arthropods, initial encounter: Secondary | ICD-10-CM

## 2012-02-26 MED ORDER — TRAMADOL HCL 50 MG PO TABS
50.0000 mg | ORAL_TABLET | Freq: Once | ORAL | Status: AC
  Administered 2012-02-26: 50 mg via ORAL
  Filled 2012-02-26: qty 1

## 2012-02-26 MED ORDER — TRAMADOL HCL 50 MG PO TABS: 50.0000 mg | ORAL_TABLET | Freq: Four times a day (QID) | ORAL | Status: AC | PRN

## 2012-02-26 MED ORDER — DOXYCYCLINE HYCLATE 100 MG PO CAPS: 100.0000 mg | ORAL_CAPSULE | Freq: Two times a day (BID) | ORAL | Status: AC

## 2012-02-26 NOTE — ED Notes (Signed)
Patient transported to CT 

## 2012-02-26 NOTE — ED Provider Notes (Addendum)
History    Scribed for Kirsi Hugh Smitty Cords, MD, the patient was seen in room MH05/MH05. This chart was scribed by Katha Cabal.   CSN: [id number]  Arrival date & time 02/26/12  2206   First MD Initiated Contact with Patient 02/26/12 2313      Chief Complaint  Patient presents with  . Headache    (Consider location/radiation/quality/duration/timing/severity/associated sxs/prior treatment) Patient is a 36 y.o. male presenting with headaches. The history is provided by the patient. No language interpreter was used.  Headache  This is a new problem. The current episode started 12 to 24 hours ago. The problem occurs constantly. The problem has not changed since onset.The headache is associated with nothing. The pain is located in the right unilateral region. The quality of the pain is described as throbbing. The pain is at a severity of 8/10. The pain is severe. The pain does not radiate. Pertinent negatives include no anorexia, no fever, no malaise/fatigue, no chest pressure, no near-syncope, no orthopnea, no palpitations, no syncope, no shortness of breath, no nausea and no vomiting. He has tried nothing for the symptoms. The treatment provided no relief.   [name], MD entered patient's room at 11:19 PM Bradley Zhang is a 36 y.o. male who presents to the Emergency Department complaining of persistent headache since 4 PM today.   Symptoms are associated with ringing in right ear. Denies recent trauma or recent sick contacts.   Symptoms are not associated with cough, sore throat, congestion, shortness of breath, chest pain. Baseline diarrhea with Chron's disease.  Patient travelled to [place] recently.  No weakness numbness no changes in speech or cognition.  Some tinnitus in the right ear.  Pain starts in the feet and goes all the way up to the head.  No changes in vision.  Did have a tick on him that he removed 2 weeks ago.  Not sudden onset.  Not the worst HA of the  life.  No rashes.  No stiff neck.  No ataxia.    Past Medical History  Diagnosis Date  . Crohn disease   . Anemia     History reviewed. No pertinent past surgical history.  Family History  Problem Relation Age of Onset  . Diabetes Father     History  Substance Use Topics  . Smoking status: Never Smoker   . Smokeless tobacco: Not on file  . Alcohol Use: 0.6 oz/week    1 Cans of beer per week     weekly      Review of Systems  Constitutional: Negative for fever and malaise/fatigue.  HENT: Negative for facial swelling, trouble swallowing, neck pain and neck stiffness.   Eyes: Negative for photophobia and visual disturbance.  Respiratory: Negative for chest tightness and shortness of breath.   Cardiovascular: Negative for chest pain, palpitations, orthopnea, leg swelling, syncope and near-syncope.  Gastrointestinal: Negative for nausea, vomiting, abdominal distention and anorexia.  Musculoskeletal: Negative for back pain and gait problem.  Skin: Negative for rash.  Neurological: Positive for headaches. Negative for tremors, seizures, syncope, facial asymmetry, speech difficulty, weakness, light-headedness and numbness.  Hematological: Positive for adenopathy.  All other systems reviewed and are negative.  Remaining review of systems negative except as noted in the HPI.    Allergies  Nsaids  Home Medications   Current Outpatient Rx  Name Route Sig Dispense Refill  . ACETAMINOPHEN 500 MG PO TABS Oral Take 1,000 mg by mouth every 6 (six) hours as needed. For  pain.    Marland Kitchen FOLIC ACID 1 MG PO TABS Oral Take 1 mg by mouth daily.      Marland Kitchen MESALAMINE 400 MG PO TBEC Oral Take 800 mg by mouth 2 (two) times daily before a meal.        BP 127/80  Pulse 60  Temp 98 F (36.7 C) (Oral)  Resp 20  Ht 6\' 1"  (1.854 m)  Wt 220 lb (99.791 kg)  BMI 29.03 kg/m2  SpO2 100%  Physical Exam  Constitutional: He is oriented to person, place, and time. He appears well-developed and  well-nourished.  HENT:  Head: Normocephalic and atraumatic.  Right Ear: Tympanic membrane and ear canal normal. Tympanic membrane is not erythematous and not bulging.  Left Ear: Tympanic membrane and ear canal normal.  Mouth/Throat: Oropharynx is clear and moist and mucous membranes are normal. No oropharyngeal exudate or posterior oropharyngeal erythema.       No traumas or tears to bilateral TMs  Eyes: EOM are normal. Pupils are equal, round, and reactive to light.  Neck: Neck supple. No tracheal deviation present.       No  LAN of the neck, supraclavicular fossa, nor axilla, nor groin nor behind the knees  Cardiovascular: Normal rate, regular rhythm and intact distal pulses.  Exam reveals no gallop and no friction rub.   No murmur heard. Pulmonary/Chest: Effort normal and breath sounds normal. No stridor. No respiratory distress.  Abdominal: Soft. Bowel sounds are normal. There is no tenderness. There is no rebound and no guarding.  Musculoskeletal: Normal range of motion. He exhibits no edema and no tenderness.  Lymphadenopathy:    He has no cervical adenopathy.  Neurological: He is alert and oriented to person, place, and time. He has normal reflexes. He displays normal reflexes. No cranial nerve deficit. Coordination normal.       cranial nerves 2-12 intact, sensation intact, 5/5 in bilateral upper and lower extremities, alert and orientated in all spheres, GCS of 15   Skin: Skin is warm and dry.  Psychiatric: He has a normal mood and affect. His behavior is normal.       Intact cognition    ED Course  Procedures (including critical care time)   DIAGNOSTIC STUDIES: Oxygen Saturation is 100% on room air, normal by my interpretation.     COORDINATION OF CARE: 11:30 PM  Physical exam complete.      LABS / RADIOLOGY:   Labs Reviewed  GLUCOSE, CAPILLARY - Abnormal; Notable for the following:    Glucose-Capillary 104 (*)     All other components within normal limits   No  results found.       MDM  Normal head CT.  No indication for further imaging at this time. No indication for LP.  Due to tick exposure will treat for tick borne illness follow up in PMD office Monday for testing.  Return for fevers, stiff neck weakness numbness changes in speech rashes on the skin or any concerns.  Follow up with neurology for paresthesias patient and mother verbalize understanding and agree to follow up.   Patient has no chest pain or pulmonary symptoms.  PERC negative and [place] 0     MEDICATIONS GIVEN IN THE E.D. Scheduled Meds:   Continuous Infusions:       IMPRESSION: No diagnosis found.   NEW MEDICATIONS: New Prescriptions   No medications on file     I personally performed the services described in this documentation, which was scribed in  my presence. The recorded information has been reviewed and considered.      Date: 04/13/2012  Rate:51  Rhythm: sinus bradycardia  QRS Axis: normal  Intervals: normal  ST/T Wave abnormalities: normal  Conduction Disutrbances:none  Narrative Interpretation:   Old EKG Reviewed: none available       Savanna Dooley K Latroy Gaymon-Rasch, MD 02/27/12 0043  Alitza Cowman K Khaylee Mcevoy-Rasch, MD 04/13/12 [phone]

## 2012-02-26 NOTE — ED Notes (Signed)
I took cbg and got 104 mg/dcltr.

## 2012-02-26 NOTE — ED Notes (Addendum)
Pt states he feels faint. Feet and hands have periodically gone numb today and hearing is "inand out". Father had MI at 19. Family hx of diabetes as well. Tick bite 3 months ago.

## 2012-02-26 NOTE — ED Notes (Signed)
Patient gave urine sample, I will leave until order needed or placed. I ran ecg and gave copy to Dr. Fonnie Jarvis, then I took cbg reading and got 104 mg/dcltr.

## 2012-02-27 NOTE — ED Notes (Signed)
rx x 2 given for doxycycline and tramadol- family present to drive

## 2012-03-29 ENCOUNTER — Other Ambulatory Visit: Payer: Self-pay | Admitting: Family Medicine

## 2012-03-29 DIAGNOSIS — R42 Dizziness and giddiness: Secondary | ICD-10-CM

## 2012-03-30 ENCOUNTER — Ambulatory Visit
Admission: RE | Admit: 2012-03-30 | Discharge: 2012-03-30 | Disposition: A | Discharge status: Home / Self Care | Payer: Managed Care, Other (non HMO) | Source: Ambulatory Visit | Attending: Family Medicine | Admitting: Family Medicine

## 2012-03-30 DIAGNOSIS — R42 Dizziness and giddiness: Secondary | ICD-10-CM

## 2012-03-30 MED ORDER — GADOBENATE DIMEGLUMINE 529 MG/ML IV SOLN
20.0000 mL | Freq: Once | INTRAVENOUS | Status: AC | PRN
  Administered 2012-03-30: 20 mL via INTRAVENOUS

## 2012-04-24 ENCOUNTER — Other Ambulatory Visit: Payer: Self-pay | Admitting: Gastroenterology

## 2012-04-24 ENCOUNTER — Ambulatory Visit
Admission: RE | Admit: 2012-04-24 | Discharge: 2012-04-24 | Disposition: A | Discharge status: Home / Self Care | Payer: Managed Care, Other (non HMO) | Source: Ambulatory Visit | Attending: Gastroenterology | Admitting: Gastroenterology

## 2012-04-24 DIAGNOSIS — R194 Change in bowel habit: Secondary | ICD-10-CM

## 2012-05-01 ENCOUNTER — Emergency Department (HOSPITAL_BASED_OUTPATIENT_CLINIC_OR_DEPARTMENT_OTHER): Payer: Managed Care, Other (non HMO)

## 2012-05-01 ENCOUNTER — Emergency Department (HOSPITAL_BASED_OUTPATIENT_CLINIC_OR_DEPARTMENT_OTHER)
Admission: EM | Admit: 2012-05-01 | Discharge: 2012-05-01 | Disposition: A | Discharge status: Home / Self Care | Payer: Managed Care, Other (non HMO) | Attending: Emergency Medicine | Admitting: Emergency Medicine

## 2012-05-01 ENCOUNTER — Encounter (HOSPITAL_BASED_OUTPATIENT_CLINIC_OR_DEPARTMENT_OTHER): Payer: Self-pay | Admitting: Family Medicine

## 2012-05-01 DIAGNOSIS — Z87891 Personal history of nicotine dependence: Secondary | ICD-10-CM | POA: Insufficient documentation

## 2012-05-01 DIAGNOSIS — D649 Anemia, unspecified: Secondary | ICD-10-CM | POA: Insufficient documentation

## 2012-05-01 DIAGNOSIS — Z79899 Other long term (current) drug therapy: Secondary | ICD-10-CM | POA: Insufficient documentation

## 2012-05-01 DIAGNOSIS — K5090 Crohn's disease, unspecified, without complications: Secondary | ICD-10-CM | POA: Insufficient documentation

## 2012-05-01 DIAGNOSIS — R091 Pleurisy: Secondary | ICD-10-CM

## 2012-05-01 LAB — TROPONIN I: Troponin I: <0.3 ng/mL (ref <0.30)

## 2012-05-01 LAB — URINALYSIS, ROUTINE W REFLEX MICROSCOPIC
Bilirubin Urine: NEGATIVE
Hgb urine dipstick: NEGATIVE
Nitrite: NEGATIVE
Specific Gravity, Urine: 1.004 — ABNORMAL LOW (ref 1.005–1.030)
Urobilinogen, UA: 0.2 mg/dL (ref 0.0–1.0)
pH: 7.5 (ref 5.0–8.0)

## 2012-05-01 MED ORDER — OXYCODONE-ACETAMINOPHEN 5-325 MG PO TABS: 2.0000 | ORAL_TABLET | Freq: Four times a day (QID) | ORAL | Status: DC | PRN

## 2012-05-01 NOTE — ED Notes (Signed)
Patient transported to X-ray 

## 2012-05-01 NOTE — ED Notes (Addendum)
Pt c/o dull pain to left chest x 2 days with deep inspiration, denies pain otherwise. Also c/o dizziness onset this morning with increased fatigue x "several weeks". Pt denies n/v/d, denies shob. Pt sts pain is 5/10 with deep inspiration and denies pain at rest. Pt reports h/o similar chest pain with work up.

## 2012-05-01 NOTE — ED Provider Notes (Signed)
History     CSN: [id number]  Arrival date & time 05/01/12  0945   First MD Initiated Contact with Patient 05/01/12 1113      Chief Complaint  Patient presents with  . Chest Pain    (Consider location/radiation/quality/duration/timing/severity/associated sxs/prior treatment) HPI This 36 year old male has constant left upper anterior chest pain which is pleuritic for the last several days, he is no fever cough and shortness of breath or exertional pain. He is no abdominal pain vomiting or bloody stools. He had a flareup of his Crohn's disease abdominal pain last week which is now resolved. He has several months of chronic generalized fatigue but that is not new recently. There is no treatment prior to arrival. His pain is mild to moderate. He does not want pain medicines now in the ED. PERC neg. Past Medical History  Diagnosis Date  . Crohn disease   . Anemia     History reviewed. No pertinent past surgical history.  Family History  Problem Relation Age of Onset  . Diabetes Father     History  Substance Use Topics  . Smoking status: Former Games developer  . Smokeless tobacco: Not on file  . Alcohol Use: 0.6 oz/week    1 Cans of beer per week     weekly      Review of Systems 10 Systems reviewed and are negative for acute change except as noted in the HPI. Allergies  Nsaids  Home Medications   Current Outpatient Rx  Name Route Sig Dispense Refill  . ACETAMINOPHEN 500 MG PO TABS Oral Take 1,000 mg by mouth every 6 (six) hours as needed. For pain.    Marland Kitchen FOLIC ACID 1 MG PO TABS Oral Take 1 mg by mouth daily.      Marland Kitchen MESALAMINE 400 MG PO TBEC Oral Take 800 mg by mouth 2 (two) times daily before a meal.      . OXYCODONE-ACETAMINOPHEN 5-325 MG PO TABS Oral Take 2 tablets by mouth every 6 (six) hours as needed for pain. 12 tablet 0    BP 112/55  Pulse 62  Temp 97.7 F (36.5 C) (Oral)  Resp 13  Ht 6\' 1"  (1.854 m)  Wt 210 lb (95.255 kg)  BMI 27.71 kg/m2  SpO2  99%  Physical Exam  Nursing note and vitals reviewed. Constitutional:       Awake, alert, nontoxic appearance.  HENT:  Head: Atraumatic.  Eyes: Right eye exhibits no discharge. Left eye exhibits no discharge.  Neck: Neck supple.  Cardiovascular: Normal rate and regular rhythm.   No murmur heard. Pulmonary/Chest: Effort normal and breath sounds normal. No respiratory distress. He has no wheezes. He has no rales. He exhibits no tenderness.  Abdominal: Soft. Bowel sounds are normal. He exhibits no distension and no mass. There is no tenderness. There is no rebound and no guarding.  Musculoskeletal: He exhibits no tenderness.       Baseline ROM, no obvious new focal weakness. Back is nontender.  Neurological:       Mental status and motor strength appears baseline for patient and situation.  Skin: No rash noted.  Psychiatric: He has a normal mood and affect.    ED Course  Procedures (including critical care time) ECG: Normal sinus rhythm with sinus arrhythmia, ventricular rate 77, normal axis, incomplete right bundle branch block, no acute ischemic changes noted, no significant change compared with August 2013 Labs Reviewed  URINALYSIS, ROUTINE W REFLEX MICROSCOPIC - Abnormal; Notable for the following:  Specific Gravity, Urine 1.004 (*)     All other components within normal limits  TROPONIN I   No results found.   1. Pleurisy       MDM  Pt stable in ED with no significant deterioration in condition.  Patient / Family / Caregiver informed of clinical course, understand medical decision-making process, and agree with plan.  I doubt any other EMC precluding discharge at this time including, but not necessarily limited to the following:ACS, PE.        Hurman Horn, MD 05/05/12 2057

## 2012-05-01 NOTE — ED Notes (Signed)
Pt. Is in no distress with no complaints at this time.

## 2012-12-07 ENCOUNTER — Other Ambulatory Visit: Payer: Self-pay | Admitting: Family Medicine

## 2012-12-07 DIAGNOSIS — R1011 Right upper quadrant pain: Secondary | ICD-10-CM

## 2012-12-12 ENCOUNTER — Ambulatory Visit
Admission: RE | Admit: 2012-12-12 | Discharge: 2012-12-12 | Disposition: A | Discharge status: Home / Self Care | Payer: Managed Care, Other (non HMO) | Source: Ambulatory Visit | Attending: Family Medicine | Admitting: Family Medicine

## 2012-12-12 DIAGNOSIS — R1011 Right upper quadrant pain: Secondary | ICD-10-CM

## 2013-06-10 ENCOUNTER — Encounter: Payer: Self-pay | Admitting: Cardiology

## 2013-06-11 ENCOUNTER — Ambulatory Visit: Payer: Managed Care, Other (non HMO) | Admitting: Cardiology

## 2013-07-02 DIAGNOSIS — K219 Gastro-esophageal reflux disease without esophagitis: Secondary | ICD-10-CM | POA: Insufficient documentation

## 2013-07-06 ENCOUNTER — Encounter: Payer: Self-pay | Admitting: Cardiology

## 2013-07-31 ENCOUNTER — Ambulatory Visit (INDEPENDENT_AMBULATORY_CARE_PROVIDER_SITE_OTHER): Payer: Managed Care, Other (non HMO) | Admitting: Cardiology

## 2013-07-31 ENCOUNTER — Encounter: Payer: Self-pay | Admitting: Cardiology

## 2013-07-31 ENCOUNTER — Encounter (INDEPENDENT_AMBULATORY_CARE_PROVIDER_SITE_OTHER): Payer: Self-pay

## 2013-07-31 VITALS — BP 128/82 | HR 83 | Ht 73.0 in | Wt 221.0 lb

## 2013-07-31 DIAGNOSIS — R5383 Other fatigue: Secondary | ICD-10-CM | POA: Insufficient documentation

## 2013-07-31 DIAGNOSIS — R5381 Other malaise: Secondary | ICD-10-CM

## 2013-07-31 DIAGNOSIS — Z8249 Family history of ischemic heart disease and other diseases of the circulatory system: Secondary | ICD-10-CM

## 2013-07-31 DIAGNOSIS — R079 Chest pain, unspecified: Secondary | ICD-10-CM

## 2013-07-31 MED ORDER — METOPROLOL TARTRATE 50 MG PO TABS: ORAL_TABLET | ORAL | Status: DC

## 2013-07-31 NOTE — Patient Instructions (Addendum)
Your physician recommends that you return for a FASTING lipomed  profile /BMET/CBCd/TSH.  Schedule an appointment for a Coronary CTA scan. Take metoprolol tartrate 50mg  (1 tablet) 1.5-2 hours before the test.   Your physician recommends that you schedule a follow-up appointment in: 3 weeks with Dr Shirlee LatchMcLean.

## 2013-07-31 NOTE — Progress Notes (Signed)
Patient ID: Bradley GantChristopher L Zhang, male   DOB: 07/14/1975, 38 y.o.   MRN: [id number] 38 yo presents for cardiology evaluation.  Patient's father had an MI at age 38.  For the last 2-3 months, he has felt "bad."  He has had "terrible" fatigue over that period.  He gets lightheaded periodically (no trigger) periodically as well.  He feels like he will pass out but has never truly passed out.  No tachypalpitations.  He does not sound orthostatic.  He has been short of breath walking up a flight of steps or with moderate exertion.  He gets chest pressure at times.  Usually it is with exertion, but the amount of exertion does not seem reproducible.  Symptoms resolve with rest.  He also has GERD-like symptoms after meals.    He had an ETT 4-5 months ago at [facility] that was normal per his report.  He was having the chest pain symptoms at that time but the other symptoms had not started.  He no longer smokes .   ECG: NSR, normal  PMH: 1. Colitis: Initially told that he has Crohns disease.  [facility], told he does not have Crohns.  2. Anemia 3. GERD 4. ETT: [facility] in 2014 [facility] per his report.   SH: Prior smoker (quit 2003), 4 kids, married, manages [employer].   FH: Father with MI at 4643  ROS: All systems reviewed and negative except as per HPI.   Current Outpatient Prescriptions  Medication Sig Dispense Refill  . dexlansoprazole (DEXILANT) 60 MG capsule Take 60 mg by mouth daily.      . Ranitidine HCl (ZANTAC PO) Take by mouth daily.      . metoprolol (LOPRESSOR) 50 MG tablet One tablet 1.5-2 hours prior to CCTA  1 tablet  0   No current facility-administered medications for this visit.    BP 128/82  Pulse 83  Ht 6\' 1"  (1.854 m)  Wt 100.245 kg (221 lb)  BMI 29.16 kg/m2 General: NAD Neck: No JVD, no thyromegaly or thyroid nodule.  Lungs: Clear to auscultation bilaterally with normal respiratory effort. CV: Nondisplaced PMI.  Heart regular S1/S2, no S3/S4, no  murmur.  No peripheral edema.  No carotid bruit.  Normal pedal pulses.  Abdomen: Soft, nontender, no hepatosplenomegaly, no distention.  Skin: Intact without lesions or rashes.  Neurologic: Alert and oriented x 3.  Psych: Normal affect. Extremities: No clubbing or cyanosis.  HEENT: Normal.   Assessment/Plan: 1. Chest pain/exertional dyspnea: Patient has been having current symptom complex for about 2-3 months.  He has generalized malaise, exertional chest pressure (but not necessarily reproducible by a certain amount of exertion), lightheadedness, and exertional dyspnea.  He had an ETT 4-5 months ago that was normal per his report.  His father had an MI at age 38, and he is very worried that his symptoms are cardiac symptoms.   - Given his family history and exertional symptoms, I will arrange for a coronary CT angiogram.  Even if he does not have obstructive disease, it will show me if he has any plaque.  - Given his family history, I will get a Lipomed profile.  Will have a relatively low threshold to start a statin.  2. Fatigue/malaise: Check CBC and TSH in addition to coronary CTA.   Followup after CTA.   Marca AnconaDalton Adalea Handler 07/31/2013 5:37 PM

## 2013-08-01 ENCOUNTER — Other Ambulatory Visit: Payer: Managed Care, Other (non HMO)

## 2013-08-07 ENCOUNTER — Ambulatory Visit: Payer: Managed Care, Other (non HMO) | Admitting: Cardiology

## 2013-08-08 ENCOUNTER — Encounter: Payer: Self-pay | Admitting: Cardiology

## 2013-08-09 ENCOUNTER — Other Ambulatory Visit: Payer: Self-pay | Admitting: Cardiology

## 2013-08-09 ENCOUNTER — Other Ambulatory Visit (INDEPENDENT_AMBULATORY_CARE_PROVIDER_SITE_OTHER): Payer: Managed Care, Other (non HMO)

## 2013-08-09 DIAGNOSIS — Z8249 Family history of ischemic heart disease and other diseases of the circulatory system: Secondary | ICD-10-CM

## 2013-08-09 DIAGNOSIS — R079 Chest pain, unspecified: Secondary | ICD-10-CM

## 2013-08-09 LAB — CBC WITH DIFFERENTIAL/PLATELET
BASOS ABS: 0 10*3/uL (ref 0.0–0.1)
Basophils Relative: 0.6 % (ref 0.0–3.0)
EOS ABS: 0.4 10*3/uL (ref 0.0–0.7)
EOS PCT: 5.1 % — AB (ref 0.0–5.0)
HCT: 44.3 % (ref 39.0–52.0)
Hemoglobin: 14.7 g/dL (ref 13.0–17.0)
Lymphocytes Relative: 27.5 % (ref 12.0–46.0)
Lymphs Abs: 2.1 10*3/uL (ref 0.7–4.0)
MCHC: 33.1 g/dL (ref 30.0–36.0)
MCV: 99 fl (ref 78.0–100.0)
MONO ABS: 0.5 10*3/uL (ref 0.1–1.0)
Monocytes Relative: 6.9 % (ref 3.0–12.0)
NEUTROS PCT: 59.9 % (ref 43.0–77.0)
Neutro Abs: 4.5 10*3/uL (ref 1.4–7.7)
PLATELETS: 274 10*3/uL (ref 150.0–400.0)
RBC: 4.48 Mil/uL (ref 4.22–5.81)
RDW: 13.4 % (ref 11.5–14.6)
WBC: 7.6 10*3/uL (ref 4.5–10.5)

## 2013-08-09 LAB — BASIC METABOLIC PANEL
BUN: 10 mg/dL (ref 6–23)
CHLORIDE: 105 meq/L (ref 96–112)
CO2: 28 mEq/L (ref 19–32)
CREATININE: 1 mg/dL (ref 0.4–1.5)
Calcium: 9.6 mg/dL (ref 8.4–10.5)
GFR: 86.84 mL/min (ref >60.00)
GLUCOSE: 83 mg/dL (ref 70–99)
Potassium: 4 mEq/L (ref 3.5–5.1)
Sodium: 139 mEq/L (ref 135–145)

## 2013-08-09 LAB — TSH: TSH: 0.29 u[IU]/mL — AB (ref 0.35–5.50)

## 2013-08-10 ENCOUNTER — Other Ambulatory Visit: Payer: Self-pay

## 2013-08-10 ENCOUNTER — Telehealth: Payer: Self-pay | Admitting: Cardiology

## 2013-08-10 DIAGNOSIS — R7989 Other specified abnormal findings of blood chemistry: Secondary | ICD-10-CM

## 2013-08-10 LAB — NMR LIPOPROFILE WITH LIPIDS
Cholesterol, Total: 199 mg/dL (ref <200)
HDL Particle Number: 32.6 umol/L (ref >=30.5)
HDL SIZE: 8.6 nm — AB (ref >=9.2)
HDL-C: 49 mg/dL (ref >=40)
LARGE HDL: 2.1 umol/L — AB (ref >=4.8)
LARGE VLDL-P: 0.8 nmol/L (ref <=2.7)
LDL (calc): 130 mg/dL — ABNORMAL HIGH (ref <100)
LDL PARTICLE NUMBER: 1876 nmol/L — AB (ref <1000)
LDL SIZE: 20.3 nm — AB (ref >20.5)
LP-IR Score: 53 — ABNORMAL HIGH (ref <=45)
SMALL LDL PARTICLE NUMBER: 1189 nmol/L — AB (ref <=527)
Triglycerides: 101 mg/dL (ref <150)
VLDL SIZE: 43.9 nm (ref <=46.6)

## 2013-08-10 NOTE — Telephone Encounter (Signed)
Returned call to patient lab results given.Patient will have repeat tsh,free t3,free t4 Monday 08/13/13.

## 2013-08-10 NOTE — Telephone Encounter (Signed)
Follow Up ° ° ° °Pt is following up on test results. Please call. °

## 2013-08-13 ENCOUNTER — Other Ambulatory Visit (INDEPENDENT_AMBULATORY_CARE_PROVIDER_SITE_OTHER): Payer: Managed Care, Other (non HMO)

## 2013-08-13 DIAGNOSIS — R7989 Other specified abnormal findings of blood chemistry: Secondary | ICD-10-CM

## 2013-08-13 DIAGNOSIS — R946 Abnormal results of thyroid function studies: Secondary | ICD-10-CM

## 2013-08-13 LAB — T3, FREE: T3, Free: 3.1 pg/mL (ref 2.3–4.2)

## 2013-08-13 LAB — TSH: TSH: 0.41 u[IU]/mL (ref 0.35–5.50)

## 2013-08-13 LAB — T4, FREE: Free T4: 0.89 ng/dL (ref 0.60–1.60)

## 2013-08-14 ENCOUNTER — Ambulatory Visit (HOSPITAL_COMMUNITY)
Admission: RE | Admit: 2013-08-14 | Discharge: 2013-08-14 | Disposition: A | Discharge status: Home / Self Care | Payer: Managed Care, Other (non HMO) | Source: Ambulatory Visit | Attending: Cardiology | Admitting: Cardiology

## 2013-08-14 DIAGNOSIS — R079 Chest pain, unspecified: Secondary | ICD-10-CM

## 2013-08-14 DIAGNOSIS — Z8249 Family history of ischemic heart disease and other diseases of the circulatory system: Secondary | ICD-10-CM

## 2013-08-15 ENCOUNTER — Other Ambulatory Visit: Payer: Self-pay | Admitting: *Deleted

## 2013-08-15 DIAGNOSIS — R079 Chest pain, unspecified: Secondary | ICD-10-CM

## 2013-08-15 DIAGNOSIS — Z8249 Family history of ischemic heart disease and other diseases of the circulatory system: Secondary | ICD-10-CM

## 2013-08-15 MED ORDER — ATORVASTATIN CALCIUM 10 MG PO TABS: 10.0000 mg | ORAL_TABLET | Freq: Every day | ORAL | Status: DC

## 2013-08-20 ENCOUNTER — Other Ambulatory Visit (HOSPITAL_BASED_OUTPATIENT_CLINIC_OR_DEPARTMENT_OTHER): Payer: Self-pay | Admitting: Emergency Medicine

## 2013-08-20 ENCOUNTER — Encounter (HOSPITAL_BASED_OUTPATIENT_CLINIC_OR_DEPARTMENT_OTHER): Payer: Self-pay | Admitting: Emergency Medicine

## 2013-08-20 ENCOUNTER — Encounter: Payer: Self-pay | Admitting: Cardiology

## 2013-08-20 ENCOUNTER — Ambulatory Visit (HOSPITAL_BASED_OUTPATIENT_CLINIC_OR_DEPARTMENT_OTHER)
Admission: RE | Admit: 2013-08-20 | Discharge: 2013-08-20 | Disposition: A | Discharge status: Home / Self Care | Payer: Managed Care, Other (non HMO) | Source: Ambulatory Visit | Attending: Emergency Medicine | Admitting: Emergency Medicine

## 2013-08-20 ENCOUNTER — Emergency Department (HOSPITAL_BASED_OUTPATIENT_CLINIC_OR_DEPARTMENT_OTHER): Payer: Managed Care, Other (non HMO)

## 2013-08-20 ENCOUNTER — Emergency Department (HOSPITAL_BASED_OUTPATIENT_CLINIC_OR_DEPARTMENT_OTHER)
Admission: EM | Admit: 2013-08-20 | Discharge: 2013-08-20 | Disposition: A | Discharge status: Home / Self Care | Payer: Managed Care, Other (non HMO) | Attending: Emergency Medicine | Admitting: Emergency Medicine

## 2013-08-20 ENCOUNTER — Ambulatory Visit (HOSPITAL_BASED_OUTPATIENT_CLINIC_OR_DEPARTMENT_OTHER): Admission: RE | Admit: 2013-08-20 | Payer: Managed Care, Other (non HMO) | Source: Ambulatory Visit

## 2013-08-20 DIAGNOSIS — Z8719 Personal history of other diseases of the digestive system: Secondary | ICD-10-CM | POA: Insufficient documentation

## 2013-08-20 DIAGNOSIS — M7989 Other specified soft tissue disorders: Secondary | ICD-10-CM | POA: Insufficient documentation

## 2013-08-20 DIAGNOSIS — R609 Edema, unspecified: Secondary | ICD-10-CM

## 2013-08-20 DIAGNOSIS — Z79899 Other long term (current) drug therapy: Secondary | ICD-10-CM | POA: Insufficient documentation

## 2013-08-20 DIAGNOSIS — Z862 Personal history of diseases of the blood and blood-forming organs and certain disorders involving the immune mechanism: Secondary | ICD-10-CM | POA: Insufficient documentation

## 2013-08-20 DIAGNOSIS — M79609 Pain in unspecified limb: Secondary | ICD-10-CM | POA: Insufficient documentation

## 2013-08-20 DIAGNOSIS — Z87891 Personal history of nicotine dependence: Secondary | ICD-10-CM | POA: Insufficient documentation

## 2013-08-20 DIAGNOSIS — M79606 Pain in leg, unspecified: Secondary | ICD-10-CM

## 2013-08-20 DIAGNOSIS — R233 Spontaneous ecchymoses: Secondary | ICD-10-CM | POA: Insufficient documentation

## 2013-08-20 DIAGNOSIS — Z8659 Personal history of other mental and behavioral disorders: Secondary | ICD-10-CM | POA: Insufficient documentation

## 2013-08-20 NOTE — ED Provider Notes (Signed)
CSN: [id number]     Arrival date & time 08/20/13  0014 History   First MD Initiated Contact with Patient 08/20/13 0357     Chief Complaint  Patient presents with  . Leg Pain   (Consider location/radiation/quality/duration/timing/severity/associated sxs/prior Treatment) HPI This is a 38 year old male with a six-day history of mild pain and ecchymosis over his medial left knee. He also has a sensation of the left leg is heavy. He denies injury. He became concerned about blood clots which is his reason for being here. There is no associated edema of the leg. While waiting in the ED he has noticed similar but less severe pain and ecchymosis over the medial aspect of the right knee. He denies acute shortness of breath or chest pain.  Past Medical History  Diagnosis Date  . Crohn disease   . Anemia   . Diverticulosis   . Anxiety    Past Surgical History  Procedure Laterality Date  . Mouth surgery    . Colonoscopy    . Wisdom teeth extracted    . Flexible sigmoidoscopy     Family History  Problem Relation Age of Onset  . Diabetes Father   . Heart attack Father   . Coronary artery disease Father   . Cancer Mother   . Other Mother     melanaoma  . Brain cancer Maternal Grandfather   . Heart attack Paternal Grandfather     , x 6  . Lung cancer Paternal Grandfather    History  Substance Use Topics  . Smoking status: Former Games developermoker  . Smokeless tobacco: Not on file  . Alcohol Use: 0.6 oz/week    1 Cans of beer per week     Comment: weekly, occasionally, beer, wine, liquor.     Review of Systems  All other systems reviewed and are negative.    Allergies  Review of patient's allergies indicates no active allergies.  Home Medications   Current Outpatient Rx  Name  Route  Sig  Dispense  Refill  . atorvastatin (LIPITOR) 10 MG tablet   Oral   Take 1 tablet (10 mg total) by mouth daily.   30 tablet   3   . dexlansoprazole (DEXILANT) 60 MG capsule   Oral   Take 60 mg by  mouth daily.         . metoprolol (LOPRESSOR) 50 MG tablet      One tablet 1.5-2 hours prior to CCTA   1 tablet   0   . Ranitidine HCl (ZANTAC PO)   Oral   Take by mouth daily.          BP 125/79  Pulse 79  Temp(Src) 97.9 F (36.6 C) (Oral)  Resp 18  SpO2 99%  Physical Exam General: Well-developed, well-nourished male in no acute distress; appearance consistent with age of record HENT: normocephalic; atraumatic Eyes: pupils equal, round and reactive to light; extraocular muscles intact Neck: supple Heart: regular rate and rhythm; Lungs: clear to auscultation bilaterally Abdomen: soft; nondistended; nontender; bowel sounds present Extremities: No deformity; full range of motion; pulses normal; no edema; mild tenderness and ecchymosis over the medial collateral ligaments of the knee bilaterally Neurologic: Awake, alert and oriented; motor function intact in all extremities and symmetric; no facial droop Skin: Warm and dry Psychiatric: Normal mood and affect    ED Course  Procedures (including critical care time)     MDM  Low index of suspicion for deep vein thrombosis but we'll arrange for  bilateral venous Doppler studies.    Hanley Seamen, MD 08/20/13 [phone]

## 2013-08-20 NOTE — ED Notes (Signed)
Received report of doppler study from Lonna CobbV. Pickering, NP.  Called placed to patient, verified dob.  Results of doppler study given to patient of a negative study.

## 2013-08-20 NOTE — ED Notes (Signed)
Report received 

## 2013-08-20 NOTE — ED Notes (Signed)
Pt reports Left leg pain onset Tuesday past

## 2013-08-21 ENCOUNTER — Ambulatory Visit: Payer: Managed Care, Other (non HMO) | Admitting: Cardiology

## 2013-08-24 ENCOUNTER — Ambulatory Visit (HOSPITAL_COMMUNITY): Payer: Managed Care, Other (non HMO)

## 2013-08-28 ENCOUNTER — Ambulatory Visit (HOSPITAL_COMMUNITY)
Admission: RE | Admit: 2013-08-28 | Discharge: 2013-08-28 | Disposition: A | Discharge status: Home / Self Care | Payer: Managed Care, Other (non HMO) | Source: Ambulatory Visit | Attending: Cardiology | Admitting: Cardiology

## 2013-08-28 ENCOUNTER — Encounter (HOSPITAL_COMMUNITY): Payer: Self-pay

## 2013-08-28 DIAGNOSIS — R079 Chest pain, unspecified: Secondary | ICD-10-CM | POA: Insufficient documentation

## 2013-08-28 DIAGNOSIS — Z8249 Family history of ischemic heart disease and other diseases of the circulatory system: Secondary | ICD-10-CM | POA: Insufficient documentation

## 2013-08-28 MED ORDER — NITROGLYCERIN 0.4 MG SL SUBL
0.4000 mg | SUBLINGUAL_TABLET | SUBLINGUAL | Status: DC | PRN
  Administered 2013-08-28: 0.4 mg via SUBLINGUAL
  Filled 2013-08-28: qty 25

## 2013-08-28 MED ORDER — IOHEXOL 350 MG/ML SOLN
80.0000 mL | Freq: Once | INTRAVENOUS | Status: AC | PRN
  Administered 2013-08-28: 80 mL via INTRAVENOUS

## 2013-08-28 MED ORDER — NITROGLYCERIN 0.4 MG SL SUBL
SUBLINGUAL_TABLET | SUBLINGUAL | Status: AC
  Filled 2013-08-28: qty 25

## 2013-08-30 ENCOUNTER — Ambulatory Visit (INDEPENDENT_AMBULATORY_CARE_PROVIDER_SITE_OTHER): Payer: Managed Care, Other (non HMO) | Admitting: Cardiology

## 2013-08-30 ENCOUNTER — Encounter: Payer: Self-pay | Admitting: Cardiology

## 2013-08-30 VITALS — BP 118/78 | HR 85 | Ht 73.0 in | Wt 223.0 lb

## 2013-08-30 DIAGNOSIS — R079 Chest pain, unspecified: Secondary | ICD-10-CM

## 2013-08-30 DIAGNOSIS — E785 Hyperlipidemia, unspecified: Secondary | ICD-10-CM | POA: Insufficient documentation

## 2013-08-30 NOTE — Patient Instructions (Signed)
Your physician wants you to follow-up in:  12 months.  You will receive a reminder letter in the mail two months in advance. If you don't receive a letter, please call our office to schedule the follow-up appointment.  Your physician recommends that you return for fasting lab work in:  3 months. -Week of Nov 19, 2013--Lipomed profile.  The lab opens at 7:30 AM every week day

## 2013-08-30 NOTE — Progress Notes (Signed)
Patient ID: Bradley Zhang, male   DOB: 03/03/1976, 38 y.o.   MRN: [id number] 38 yo presents for cardiology followup.  Patient's father had an MI at age 38.  For the last 2-3 months, he has felt "bad."  He has had "terrible" fatigue over that period.  He gets lightheaded periodically (no trigger) periodically as well.  He feels like he will pass out but has never truly passed out.  No tachypalpitations.  He does not sound orthostatic.  He has been short of breath walking up a flight of steps or with moderate exertion.  He gets chest pressure at times.  Usually it is with exertion, but the amount of exertion does not seem reproducible.  Symptoms resolve with rest.  He also has GERD-like symptoms after meals.  Since last appointment, these symptoms actually have seemed to improve a bit.  He still gets episodes of chest pain.   He had an ETT 4-5 months ago at [facility] that was normal per his report.  He was having the chest pain symptoms at that time but the other symptoms had not started.  He no longer smokes.   I had him do a coronary CT angiogram.  This showed no significant coronary disease.  His Lipomed profile did show a significantly elevated LDL particle number and I started him on atorvastatin 10 mg daily.  He has tolerated this so far without problems.   Labs (2/15): TSH, free T4/T3 normal; LDL 130, LDL-P 1876  PMH: 1. Colitis: Initially told that he has Crohns disease.  [facility] at [facility], told he does not have Crohns.  2. Anemia 3. GERD 4. Chest pain: ETT: Normal in 2014 at [facility] per his report.  Coronary CT angiogram (2/15) showed no significant coronary disease and coronary artery calcium score of 0.   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   FH: Father with MI at [phone]  Current Outpatient Prescriptions  Medication Sig Dispense Refill  . ALPRAZolam (XANAX) 0.5 MG tablet As needed      . atorvastatin (LIPITOR) 10 MG tablet Take 1 tablet  (10 mg total) by mouth daily.  30 tablet  3  . dexlansoprazole (DEXILANT) 60 MG capsule Take 60 mg by mouth daily.      . Ranitidine HCl (ZANTAC PO) Take by mouth daily.       No current facility-administered medications for this visit.    BP 118/78  Pulse 85  Ht 6\' 1"  (1.854 m)  Wt 101.152 kg (223 lb)  BMI 29.43 kg/m2 General: NAD Neck: No JVD, no thyromegaly or thyroid nodule.  Lungs: Clear to auscultation bilaterally with normal respiratory effort. CV: Nondisplaced PMI.  Heart regular S1/S2, no S3/S4, no murmur.  No peripheral edema.  No carotid bruit.  Normal pedal pulses.  Abdomen: Soft, nontender, no hepatosplenomegaly, no distention.  Skin: Intact without lesions or rashes.  Neurologic: Alert and oriented x 3.  Psych: Normal affect. Extremities: No clubbing or cyanosis.   Assessment/Plan: 1. Chest pain/exertional dyspnea: Patient has been having current symptom complex for about 2-3 months.  He has generalized malaise, exertional chest pressure (but not necessarily reproducible by a certain amount of exertion), lightheadedness, and exertional dyspnea.  He had an ETT 4-5 months ago that was normal per his report.  His father had an MI at age 38, and he is very worried that his symptoms are cardiac symptoms.  His coronary CT angiogram showed no significant coronary disease and  coronary artery calcium score was 0.  I suspect that his symptoms are noncardiac. 2. Hyperlipidemia: LDL particle number is significantly elevated.  Given family history of premature CAD, I would like him on at least a low dose statin.  He has started atorvastatin 10 mg daily and is tolerating it without problems.  Continue.  Will repeat Lipomed profile in 3 months.   [name] 08/30/2013 5:03 PM

## 2013-09-27 ENCOUNTER — Ambulatory Visit: Payer: Managed Care, Other (non HMO) | Admitting: Cardiology

## 2013-10-16 ENCOUNTER — Other Ambulatory Visit: Payer: Managed Care, Other (non HMO)

## 2013-10-22 IMAGING — CT CT HEAD W/O CM
1 series · 16 of 30 positions shown, 20 images · non-contrast
Comparison: 02/01/2011

CLINICAL DATA: Headache.  Tinnitus, dizziness.

CT HEAD WITHOUT CONTRAST
TECHNIQUE: Contiguous axial images were obtained from the base of
the skull through the vertex without contrast.

[Series 2: head 4.8 h37s · axial · 0.47mm/px · z∈[-107,+49]mm · 16 of 36 slices shown, 20 images]
[im 2/36  brain]
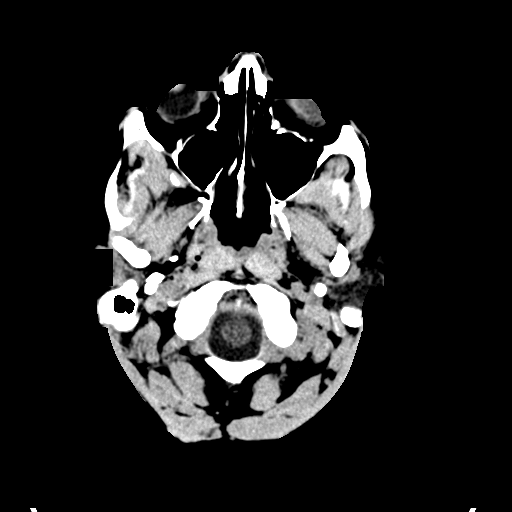
[im 2/36  bone]
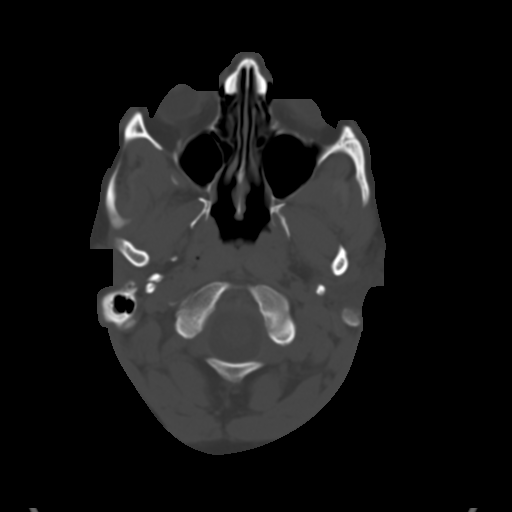
[im 4/36  brain]
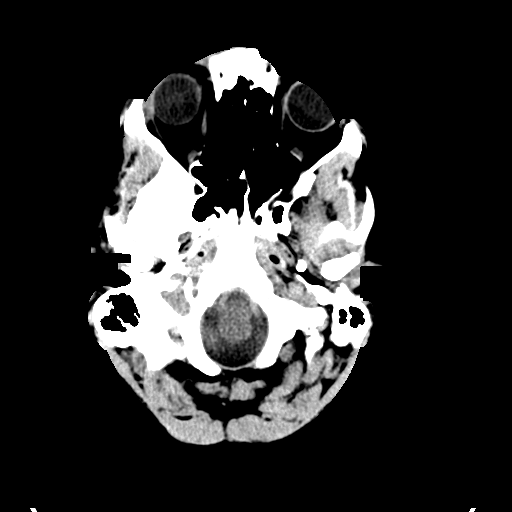
[im 7/36  brain]
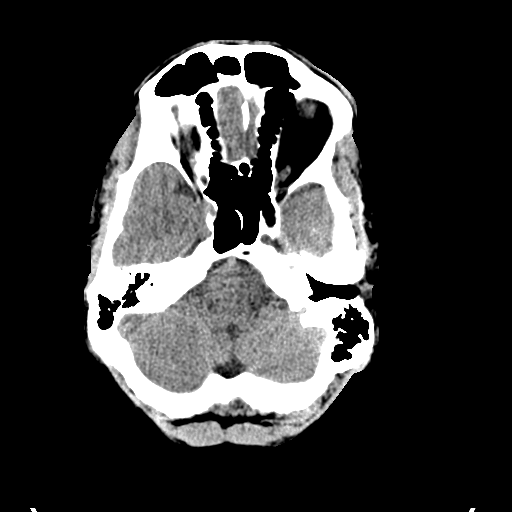
[im 9/36  brain]
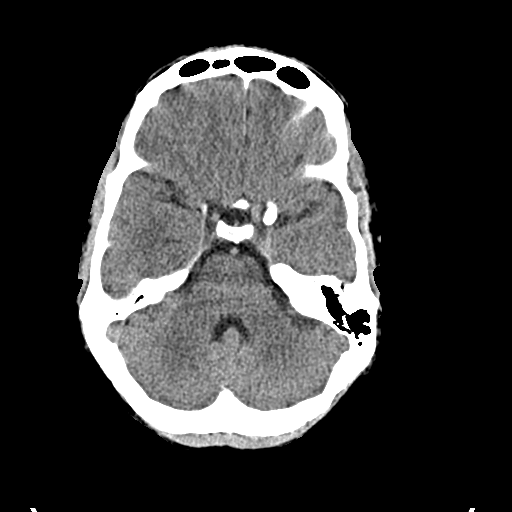
[im 10/36  brain]
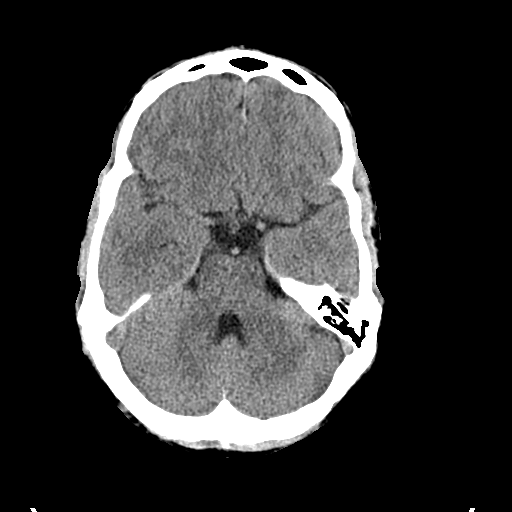
[im 10/36  bone]
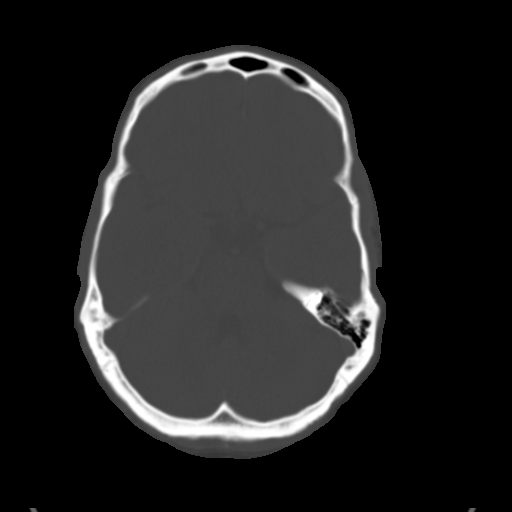
[im 13/36  brain]
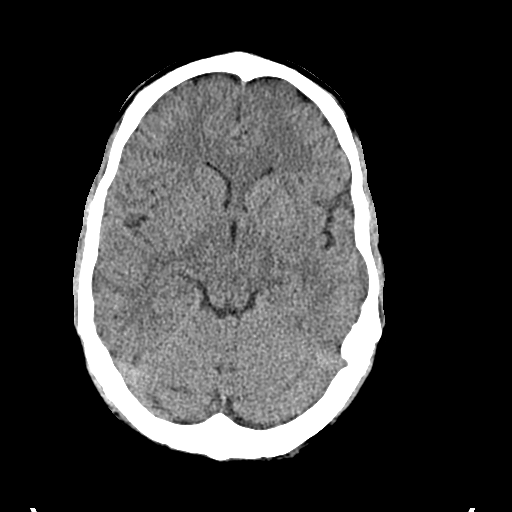
[im 15/36  brain]
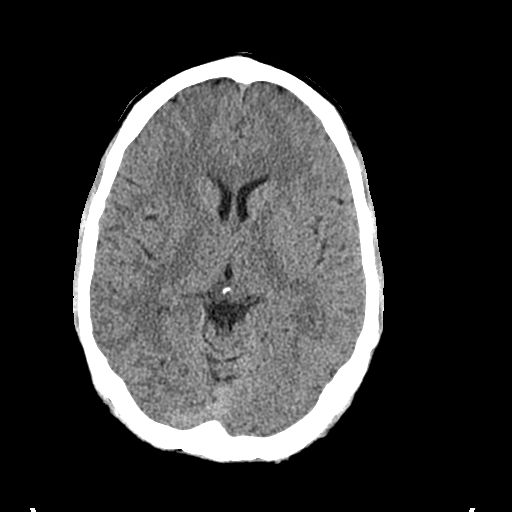
[im 17/36  brain]
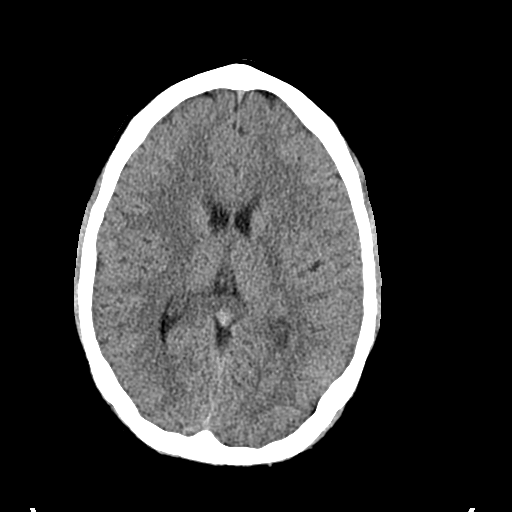
[im 19/36  brain]
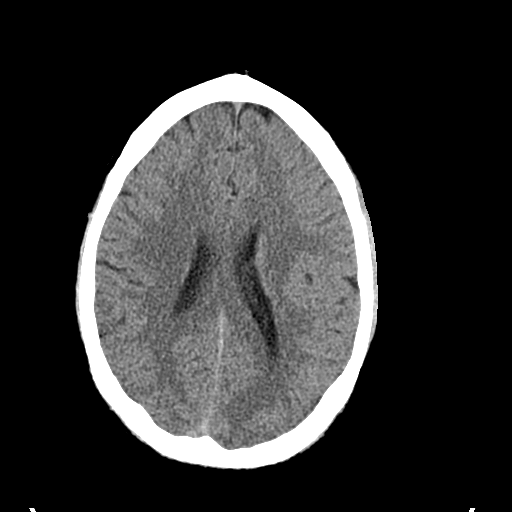
[im 19/36  bone]
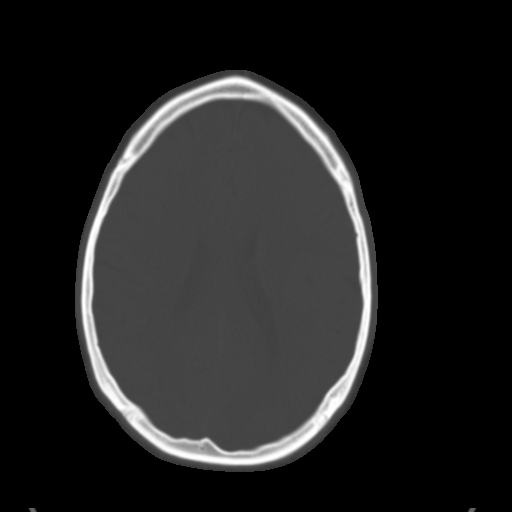
[im 21/36  brain]
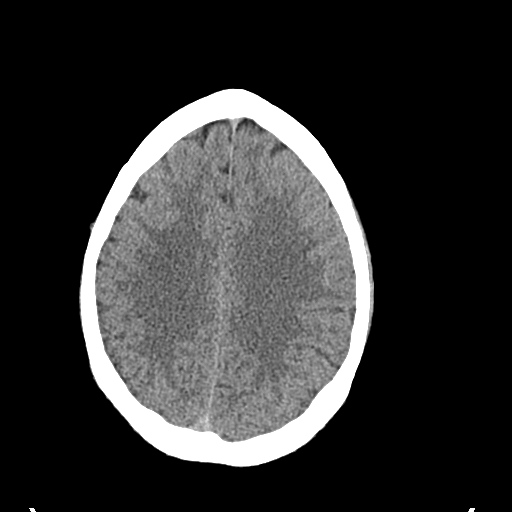
[im 23/36  brain]
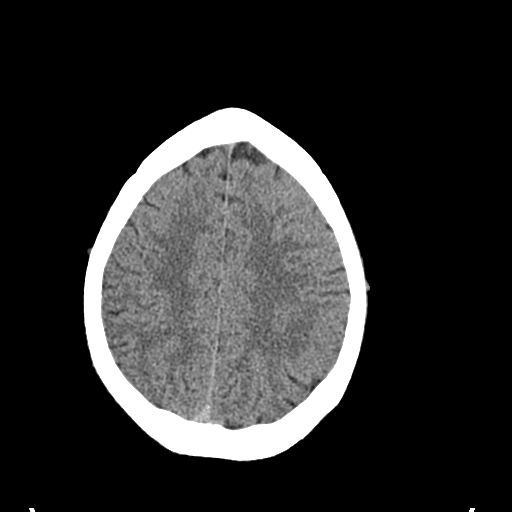
[im 26/36  brain]
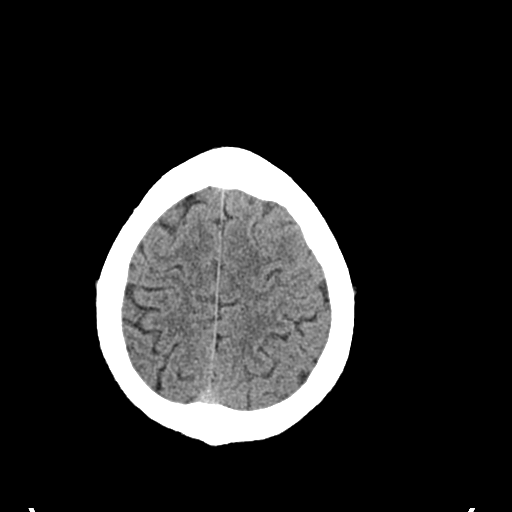
[im 27/36  brain]
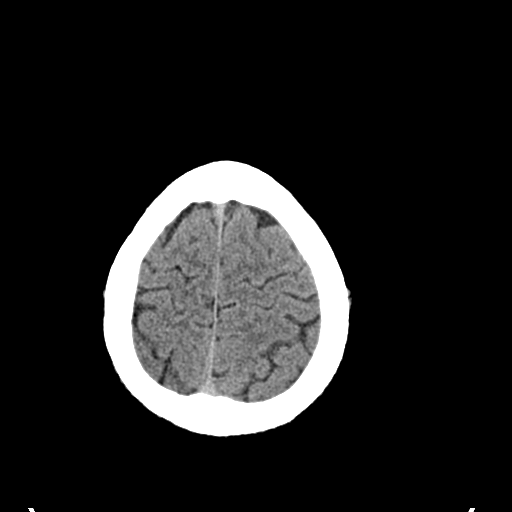
[im 27/36  bone]
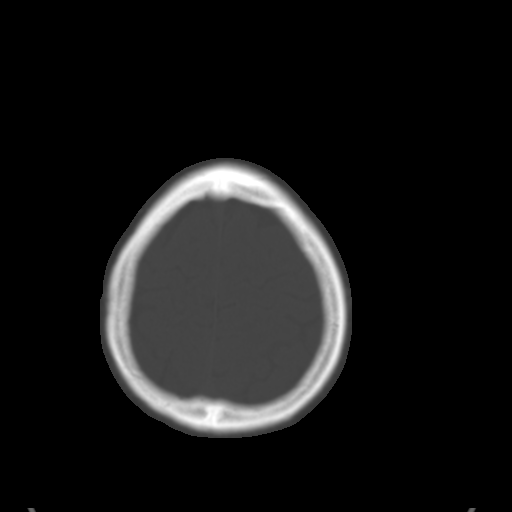
[im 29/36  brain]
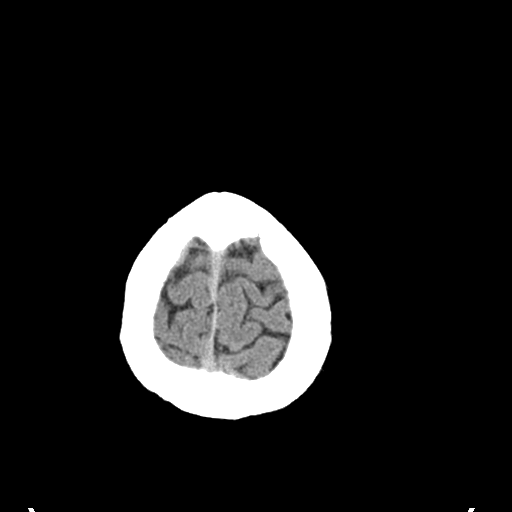
[im 32/36  brain]
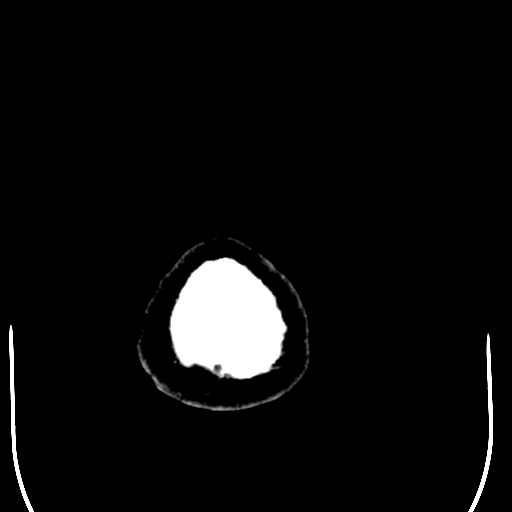
[im 34/36  brain]
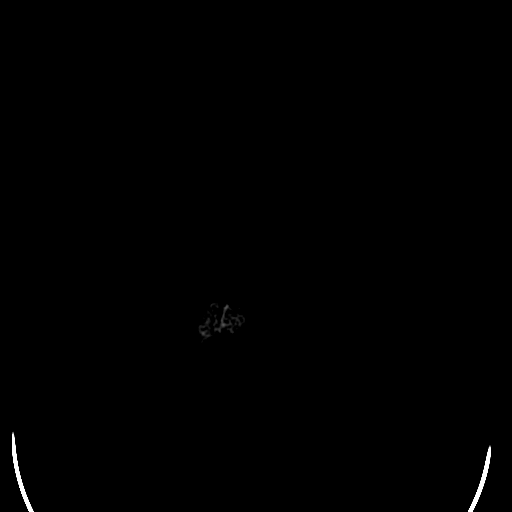

[16 of 30 positions shown; findings below may reference images not displayed]

FINDINGS: No acute intracranial abnormality.  Specifically, no
hemorrhage, hydrocephalus, mass lesion, acute infarction, or
significant intracranial injury.  No acute calvarial abnormality.
Visualized paranasal sinuses and mastoids clear.  Orbital soft
tissues unremarkable.
IMPRESSION: Normal study.

## 2013-12-05 ENCOUNTER — Other Ambulatory Visit: Payer: Self-pay | Admitting: Dermatology

## 2013-12-12 DIAGNOSIS — G5690 Unspecified mononeuropathy of unspecified upper limb: Secondary | ICD-10-CM | POA: Insufficient documentation

## 2013-12-12 DIAGNOSIS — M5030 Other cervical disc degeneration, unspecified cervical region: Secondary | ICD-10-CM | POA: Insufficient documentation

## 2014-03-12 DIAGNOSIS — R0683 Snoring: Secondary | ICD-10-CM | POA: Insufficient documentation

## 2014-03-12 DIAGNOSIS — G4710 Hypersomnia, unspecified: Secondary | ICD-10-CM | POA: Insufficient documentation

## 2014-03-12 DIAGNOSIS — G4733 Obstructive sleep apnea (adult) (pediatric): Secondary | ICD-10-CM | POA: Insufficient documentation

## 2014-09-05 ENCOUNTER — Encounter: Payer: Self-pay | Admitting: Cardiology

## 2014-09-05 ENCOUNTER — Ambulatory Visit (INDEPENDENT_AMBULATORY_CARE_PROVIDER_SITE_OTHER): Payer: Managed Care, Other (non HMO) | Admitting: Cardiology

## 2014-09-05 ENCOUNTER — Other Ambulatory Visit: Payer: Self-pay | Admitting: Cardiology

## 2014-09-05 VITALS — BP 130/78 | HR 67 | Ht 73.0 in | Wt 235.0 lb

## 2014-09-05 DIAGNOSIS — E785 Hyperlipidemia, unspecified: Secondary | ICD-10-CM

## 2014-09-05 NOTE — Patient Instructions (Signed)
Your physician recommends that you have a lipomed panel today.  Your physician wants you to follow-up in: 1 year with Dr Shirlee LatchMcLean. (Feburary 2017).  You will receive a reminder letter in the mail two months in advance. If you don't receive a letter, please call our office to schedule the follow-up appointment.

## 2014-09-06 NOTE — Progress Notes (Signed)
Patient ID: Bradley Zhang, male   DOB: 09/06/1975, 39 y.o.   MRN: [id number] PCP: Dr. Salvadore Zhang  39 yo presents for cardiology followup.  Patient's father had an MI at age 39. Given chest pain in 2015, I had him do a coronary CT angiogram.  This showed no significant coronary disease.  His Lipomed profile did show a significantly elevated LDL particle number and I started him on atorvastatin 10 mg daily.  His chest pain was likely GERD and resolved with PPI.    Since last appointment, he has been diagnosed with OSA and started on CPAP.  He has not been very active but denies any further chest pain.  No exertional dyspnea.  Main complaint is actually RUQ abdominal pain that occurs after meals.  This happens every 3-4 days.  It is different than GERD.    Labs (2/15): TSH, free T4/T3 normal; LDL 130, LDL-P 1876  ECG: NSR, normal  PMH: 1. Colitis: Initially told that he has Crohns disease.  Seen at [facility], told he does not have Crohns.  2. Anemia 3. GERD 4. Chest pain: ETT: Normal in 2014 at [facility] per his report.  Coronary CT angiogram (2/15) showed no significant coronary disease and coronary artery calcium score of 0.  5. OSA: On CPAP.   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   FH: Father with MI at [phone]  Current Outpatient Prescriptions  Medication Sig Dispense Refill  . ALPRAZolam (XANAX) 0.5 MG tablet As needed    . atorvastatin (LIPITOR) 10 MG tablet Take 1 tablet (10 mg total) by mouth daily. 30 tablet 3  . dexlansoprazole (DEXILANT) 60 MG capsule Take 60 mg by mouth daily.    . Ranitidine HCl (ZANTAC PO) Take by mouth daily.    Marland Kitchen. testosterone cypionate (DEPOTESTOTERONE CYPIONATE) 200 MG/ML injection Inject 200 mg into the muscle. Every 14 days     No current facility-administered medications for this visit.    BP 130/78 mmHg  Pulse 67  Ht 6\' 1"  (1.854 m)  Wt 235 lb (106.595 kg)  BMI 31.01 kg/m2 General: NAD Neck: No JVD, no  thyromegaly or thyroid nodule.  Lungs: Clear to auscultation bilaterally with normal respiratory effort. CV: Nondisplaced PMI.  Heart regular S1/S2, no S3/S4, no murmur.  No peripheral edema.  No carotid bruit.  Normal pedal pulses.  Abdomen: Soft, nontender, no hepatosplenomegaly, no distention.  Skin: Intact without lesions or rashes.  Neurologic: Alert and oriented x 3.  Psych: Normal affect. Extremities: No clubbing or cyanosis.   Assessment/Plan: 1. CAD risk: Patient has family history of premature CAD. He had a normal coronary CT angiogram in 2/15 for chest pain that likely was GERD.  He has been stable recently.  - Continue atorvastatin, I will repeat Lipomed profile today.  - I encouraged him to get more exercise.  2. RUQ abdominal pain: This actually sounds like it could be symptomatic cholelithiasis.  If RUQ pain after meals continues, I told him that he probably ought to get an RUQ US through his PCP.    [name] 09/06/2014 4:35 PM

## 2014-09-07 LAB — NMR LIPOPROFILE WITH LIPIDS
Cholesterol, Total: 200 mg/dL — ABNORMAL HIGH (ref 100–199)
HDL Particle Number: 29.6 umol/L — ABNORMAL LOW
HDL Size: 8.3 nm — ABNORMAL LOW
HDL-C: 45 mg/dL
LDL (calc): 137 mg/dL — ABNORMAL HIGH (ref 0–99)
LDL Particle Number: 1649 nmol/L — ABNORMAL HIGH
LDL Size: 20.7 nm
LP-IR Score: 52 — ABNORMAL HIGH
Large HDL-P: <1.3 umol/L — ABNORMAL LOW
Large VLDL-P: 1.3 nmol/L
Small LDL Particle Number: 427 nmol/L
Triglycerides: 88 mg/dL (ref 0–149)
VLDL Size: 41.7 nm

## 2014-09-09 ENCOUNTER — Other Ambulatory Visit: Payer: Self-pay | Admitting: *Deleted

## 2014-09-09 DIAGNOSIS — E785 Hyperlipidemia, unspecified: Secondary | ICD-10-CM

## 2014-09-09 DIAGNOSIS — Z8249 Family history of ischemic heart disease and other diseases of the circulatory system: Secondary | ICD-10-CM

## 2014-09-09 MED ORDER — ATORVASTATIN CALCIUM 10 MG PO TABS: 10.0000 mg | ORAL_TABLET | Freq: Every day | ORAL | Status: AC

## 2014-11-04 ENCOUNTER — Other Ambulatory Visit: Payer: Managed Care, Other (non HMO)

## 2015-01-27 ENCOUNTER — Telehealth: Payer: Self-pay | Admitting: Cardiology

## 2015-01-27 NOTE — Telephone Encounter (Signed)
NA/Voice mail

## 2015-01-27 NOTE — Telephone Encounter (Signed)
Reviewed with Tereso NewcomerScott Weaver, PA,c --per Lorin PicketScott arrange Flex appt for pt 01/28/15, pt to [facility] ED if symptoms get worse tonight.   LMTCB for pt.

## 2015-01-27 NOTE — Telephone Encounter (Signed)
New Message       Pt's wife calling stating that the pt's HR is 45 and 50 and the pt is very lethargic. Pt was seen in ER last night and pt's wife states that all of his labs came back normal and nothing else was done. Wife states that pt has a family hx of sudden cardiac death at a young age and she is very concerned and wants to know what they should do. Please call back and advise.

## 2015-01-27 NOTE — Telephone Encounter (Signed)
Pt states he has not been feeling well for about 10-12 days, he went [facility] ED yesterday because of chest pain, not feeling well.   Pt states he given GI cocktail for chest pain, pt states he was told  lab did not show MI, EKG done, did not show MI, but heart rate was low.   Pt states he has had a continuous pain/tightness in the middle of his chest to his left arm since yesterday , he denies having pain like this before. Pt states he was told by [facility] to follow up with a cardiologist.

## 2015-01-28 ENCOUNTER — Ambulatory Visit: Payer: Managed Care, Other (non HMO) | Admitting: Physician Assistant

## 2015-01-28 NOTE — Telephone Encounter (Signed)
Pt missed appt this morning, rescheduled to 01/30/15

## 2015-01-28 NOTE — Telephone Encounter (Signed)
Voicemail.

## 2015-01-28 NOTE — Telephone Encounter (Signed)
LMTCB

## 2015-01-30 ENCOUNTER — Encounter: Payer: Self-pay | Admitting: Physician Assistant

## 2015-01-30 ENCOUNTER — Ambulatory Visit (INDEPENDENT_AMBULATORY_CARE_PROVIDER_SITE_OTHER): Payer: Managed Care, Other (non HMO) | Admitting: Physician Assistant

## 2015-01-30 VITALS — BP 108/74 | HR 74 | Ht 73.0 in | Wt 219.0 lb

## 2015-01-30 DIAGNOSIS — R001 Bradycardia, unspecified: Secondary | ICD-10-CM | POA: Insufficient documentation

## 2015-01-30 DIAGNOSIS — R002 Palpitations: Secondary | ICD-10-CM | POA: Insufficient documentation

## 2015-01-30 NOTE — Progress Notes (Signed)
Cardiology Office Note   Date:  01/30/2015   ID:  Bradley Zhang, DOB 11/04/1975, MRN [id number]  PCP:  Loyce Dys, PA-C  Cardiologist:  Dr. Elliot Gurney, PA-C   Chief complaint: Symptomatic bradycardia  History of Present Illness: Bradley Zhang is a 39 y.o. male with a history of premature family history of CAD, abnormal lipomed profile, 0 calcium score and no significant CAD by cardiac CT in February 2015.  Bradley Zhang presents for evaluation of palpitations and symptomatic bradycardia.  Bradley Zhang has had several episodes of sudden onset of feeling weak and like he needed to lie down to keep from falling. He has a watch that we'll check his heart rate and he has checked his heart rate during these episodes and it is in the 40s. When his heart rate is normal, he feels fine. He gets a little chest pain with these episodes but it resolves as soon as his heart rate normalizes. He has not checked his blood pressure during the episodes. At other times, his heart rate will be in the 60s or 70s. His thyroid functions have been normal in the past and he feels completely fine, except when his heart rate is low. This has never been captured on an EKG.   Past Medical History  Diagnosis Date  . Crohn disease   . Anemia   . Diverticulosis   . Anxiety     Past Surgical History  Procedure Laterality Date  . Mouth surgery    . Colonoscopy    . Wisdom teeth extracted    . Flexible sigmoidoscopy      Current Outpatient Prescriptions  Medication Sig Dispense Refill  . ALPRAZolam (XANAX) 0.5 MG tablet Take 0.5 mg by mouth 2 (two) times daily as needed for anxiety. As needed    . atorvastatin (LIPITOR) 10 MG tablet Take 1 tablet (10 mg total) by mouth daily. 30 tablet 3  . dexlansoprazole (DEXILANT) 60 MG capsule Take 60 mg by mouth daily.    . Ranitidine HCl (ZANTAC PO) Take 1 tablet by mouth daily.     Marland Kitchen testosterone cypionate (DEPOTESTOTERONE  CYPIONATE) 200 MG/ML injection Inject 200 mg into the muscle. Every 14 days     No current facility-administered medications for this visit.    Allergies:   Review of patient's allergies indicates no known allergies.    Social History:  The patient  reports that he has quit smoking. He has quit using smokeless tobacco. His smokeless tobacco use included Snuff. He reports that he drinks about 0.6 oz of alcohol per week. He reports that he does not use illicit drugs.   Family History:  The patient's family history includes Brain cancer in his maternal grandfather; Cancer in his mother; Coronary artery disease in his father; Diabetes in his father; Heart attack in his father and paternal grandfather; Lung cancer in his paternal grandfather; Other in his mother.    ROS:  Please see the history of present illness. All other systems are reviewed and negative.    PHYSICAL EXAM: VS:  BP 108/74 mmHg  Pulse 74  Ht 6\' 1"  (1.854 m)  Wt 219 lb (99.338 kg)  BMI 28.90 kg/m2 , BMI Body mass index is 28.9 kg/(m^2). GEN: Well nourished, well developed, in no acute distress HEENT: normal Neck: no JVD, carotid bruits, or masses Cardiac: RRR; no murmurs, rubs, or gallops,no edema  Respiratory:  clear to auscultation bilaterally, normal work of breathing GI: soft,  nontender, nondistended, + BS MS: no deformity or atrophy Skin: warm and dry, no rash Neuro:  Strength and sensation are intact Psych: euthymic mood, full affect   EKG:  EKG is ordered today. The ekg ordered today demonstrates sinus rhythm, rate is normal, no acute ischemic changes.   Recent Labs: No results found for requested labs within last 365 days.    Lipid Panel    Component Value Date/Time   CHOL 200* 09/05/2014 1559   TRIG 88 09/05/2014 1559   HDL 45 09/05/2014 1559   LDLCALC 137* 09/05/2014 1559     Wt Readings from Last 3 Encounters:  01/30/15 219 lb (99.338 kg)  09/05/14 235 lb (106.595 kg)  08/30/13 223 lb  (101.152 kg)     Other studies Reviewed: Additional studies/ records that were reviewed today include: Previous office notes and ER records.  ASSESSMENT AND PLAN:  1.  Symptomatic bradycardia: It is unclear if he is having sinus bradycardia or if other arrhythmia is involved. 30 day event monitor is recommended and the patient is in agreement. He is to follow-up with Dr. Shirlee Latch or myself after the monitor to determine further plans. He is not on any rate lowering medications. TSH has been normal in the past.  Current medicines are reviewed at length with the patient today.  The patient does not have concerns regarding medicines.  The following changes have been made:  no change  Labs/ tests ordered today include:   Orders Placed This Encounter  Procedures  . Cardiac event monitor  . EKG 12-Lead     Disposition:   FU with Dr. Shirlee Latch or myself in 6 weeks   Signed, Leanna Battles  01/30/2015 1:33 PM    [facility] [address] [place], [place], [place]  [address] Phone: [phone]; Fax: [phone]

## 2015-01-30 NOTE — Patient Instructions (Addendum)
Medication Instructions:  Your physician recommends that you continue on your current medications as directed. Please refer to the Current Medication list given to you today.  Labwork: NONE  Testing/Procedures: Your physician has recommended that you wear an 30 day event monitor. Event monitors are medical devices that record the heart's electrical activity. Doctors most often [place] these monitors to diagnose arrhythmias. Arrhythmias are problems with the speed or rhythm of the heartbeat. The monitor is a small, portable device. You can wear one while you do your normal daily activities. This is usually used to diagnose what is causing palpitations/syncope (passing out).  Follow-Up: Your physician recommends that you schedule a follow-up appointment on March 12, 2015 with Theodore Demark PA at 2:00 PM.

## 2015-02-03 ENCOUNTER — Encounter: Payer: Self-pay | Admitting: *Deleted

## 2015-02-03 NOTE — Progress Notes (Signed)
Patient ID: Bradley Zhang, male   DOB: 30-Jan-1976, 39 y.o.   MRN: [id number] Patient did not show up for 02/03/2015, 2:00PM, appointment to have a 30 day cardiac event monitor applied.

## 2015-02-24 ENCOUNTER — Ambulatory Visit (INDEPENDENT_AMBULATORY_CARE_PROVIDER_SITE_OTHER): Payer: Managed Care, Other (non HMO)

## 2015-02-24 DIAGNOSIS — R001 Bradycardia, unspecified: Secondary | ICD-10-CM

## 2015-02-24 DIAGNOSIS — R002 Palpitations: Secondary | ICD-10-CM | POA: Diagnosis not present

## 2015-03-06 ENCOUNTER — Telehealth: Payer: Self-pay | Admitting: Cardiology

## 2015-03-06 NOTE — Telephone Encounter (Signed)
Pt called as last night after work he started to have palpitations and kept feeling like his hear was going fast. Pt stated that after work he went to a birthday party had 1 glass of wine and got a headache.  Pt left the party and went home, where he checked his HR and it was 100.  Pt stated that got as high as 120 at one point so he went and layed down.  Pt stated after about 10 minutes of laying down he got an electrical shock feeling across his chest with tightness, SOB, and palpitations. Pt stated that the event didn't last very long just that it scared him and at this time it was one time event.  Review pt monitors for yesterday indicating pt HR between 93 - 108 with no abnormalities.  Pt stated that he wears an Apple Watch that tracks his HR, but he usually only wears it during the day.  Pt stated that he has worn it from time to time at night and his HR has gotten as low as 35 one time.   Pt was reminded how to use the monitor and when to press the button.  Pt told he will get a call from [employer] or our office if he has an event to check on him.  Pt told if he passes out or has symptoms that will not go away he is to call 911 or go to the nearest emergency room. Pt expressed understanding no additional questions at this time.

## 2015-03-06 NOTE — Telephone Encounter (Signed)
New message    Pt is wearing holter monitor Pt had an episode last night that lasted for 1 minute with pulse rate at 175 Pt also states pulse yesterday on ride home from work was around 100 and that lasted 30 minutes Pt had headache when pulse got high and pt also had tightness in chest during the episode Pt not having any symptoms at this time except high pulse at this time

## 2015-03-12 ENCOUNTER — Ambulatory Visit: Payer: Managed Care, Other (non HMO) | Admitting: Physician Assistant

## 2015-04-03 ENCOUNTER — Ambulatory Visit (INDEPENDENT_AMBULATORY_CARE_PROVIDER_SITE_OTHER): Payer: Managed Care, Other (non HMO) | Admitting: Physician Assistant

## 2015-04-03 ENCOUNTER — Encounter: Payer: Self-pay | Admitting: Physician Assistant

## 2015-04-03 VITALS — BP 122/70 | HR 67 | Ht 73.0 in | Wt 216.0 lb

## 2015-04-03 DIAGNOSIS — R079 Chest pain, unspecified: Secondary | ICD-10-CM | POA: Diagnosis not present

## 2015-04-03 DIAGNOSIS — E785 Hyperlipidemia, unspecified: Secondary | ICD-10-CM

## 2015-04-03 DIAGNOSIS — R001 Bradycardia, unspecified: Secondary | ICD-10-CM | POA: Diagnosis not present

## 2015-04-03 DIAGNOSIS — R002 Palpitations: Secondary | ICD-10-CM

## 2015-04-03 NOTE — Patient Instructions (Signed)
Medication Instructions:  Your physician recommends that you continue on your current medications as directed. Please refer to the Current Medication list given to you today.   Labwork: None ordered   Testing/Procedures: None ordered  Follow-Up: Your physician wants you to follow-up in:  1 YEAR WITH DR. Shirlee Latch.   You will receive a reminder letter in the mail two months in advance. If you don't receive a letter, please call our office to schedule the follow-up appointment.   Any Other Special Instructions Will Be Listed Below (If Applicable).

## 2015-04-03 NOTE — Progress Notes (Signed)
Cardiology Office Note   Date:  04/03/2015   ID:  Bradley Zhang, DOB 20-Oct-1975, MRN [id number]  PCP:  Loyce Dys, PA-C  Cardiologist:   Dr Elliot Gurney, PA-C   Chief complaint: Palpitations and lightheadedness  History of Present Illness: Bradley Zhang is a 39 y.o. male with a history of premature family history of CAD, abnormal lipomed profile, 0 calcium score and no significant CAD by cardiac CT in February 2015. Seen 01/30/2015 for symptomatic bradycardia, event monitor worn.  Bradley Zhang presents for follow-up.   While wearing the event monitor, Bradley Zhang had several episodes of palpitations. Some of the more skips and there was one long run. He stated his heart rate got up to 175 per his wife.   He has had no syncope. He knows that when he sits down, he tends to feel fatigued get a little lightheaded so now he has converted to a standing desk. He likes it and feels he is doing better with that.   He was not wearing the monitor during one episode when he had walked several blocks and was feeling very tired. He sat down and took his heart rate, which was 50. He felt that was not appropriate for the level of exertion he had just had.   Other than that, he feels that he is generally doing better. He does not check his heart rate is often as he was doing, because it makes him anxious.   Past Medical History  Diagnosis Date  . Crohn disease   . Anemia   . Diverticulosis   . Anxiety   . Sinus bradycardia     Past Surgical History  Procedure Laterality Date  . Mouth surgery    . Colonoscopy    . Wisdom teeth extracted    . Flexible sigmoidoscopy      Current Outpatient Prescriptions  Medication Sig Dispense Refill  . ALPRAZolam (XANAX) 0.5 MG tablet Take 0.5 mg by mouth 2 (two) times daily as needed for anxiety. As needed    . atorvastatin (LIPITOR) 10 MG tablet Take 1 tablet (10 mg total) by mouth daily. 30 tablet 3   No  current facility-administered medications for this visit.    Allergies:   Review of patient's allergies indicates no known allergies.    Social History:  The patient  reports that he has quit smoking. He has quit using smokeless tobacco. His smokeless tobacco use included Snuff. He reports that he drinks about 0.6 oz of alcohol per week. He reports that he does not use illicit drugs.   Family History:  The patient's family history includes Brain cancer in his maternal grandfather; Cancer in his mother; Coronary artery disease in his father; Diabetes in his father; Heart attack in his father and paternal grandfather; Lung cancer in his paternal grandfather; Other in his mother.    ROS:  Please see the history of present illness. All other systems are reviewed and negative.    PHYSICAL EXAM: VS:  BP 122/70 mmHg  Pulse 67  Ht  (1.854 m)  Wt 216 lb (97.977 kg)  BMI 28.50 kg/m2 , BMI Body mass index is 28.5 kg/(m^2). GEN: Well nourished, well developed, male in no acute distress HEENT: normal Neck: no JVD, carotid bruits, or masses Cardiac: RRR; no murmurs, rubs, or gallops,no edema  Respiratory:  clear to auscultation bilaterally, normal work of breathing GI: soft, nontender, nondistended, + BS MS: no deformity or atrophy; distal pulses  2+ in all 4 extremities Skin: warm and dry, no rash Neuro:  Strength and sensation are intact Psych: euthymic mood, full affect   EKG:  EKG is ordered today. ECG shows sinus rhythm, rate 67 with normal intervals Dr Graciela Husbands reviewed it and advised no Delta wave  Recent Labs: Lipid Panel    Component Value Date/Time   CHOL 200* 09/05/2014 1559   TRIG 88 09/05/2014 1559   HDL 45 09/05/2014 1559   LDLCALC 137* 09/05/2014 1559     Wt Readings from Last 3 Encounters:  04/03/15 216 lb (97.977 kg)  01/30/15 219 lb (99.338 kg)  09/05/14 235 lb (106.595 kg)     Other studies Reviewed: Additional studies/ records that were reviewed today  include: Event monitor report, office notes and ECGs.  ASSESSMENT AND PLAN:  1.  Sinus bradycardia: Bradley Zhang has sinus bradycardia with a heart rate drops to 50. He had some episodes during the night but states he is compliant with this CPAP, which is set to auto. He has gotten lightheaded or felt a little fatigued at times, but has not had presyncope or syncope.  Advised him that he had no life-threatening heart rates. Advised him that his heart rate does drop down to 50 and this is not low enough to warrant a pacemaker. Advised him that if his symptoms worsen, he should be reevaluated. Advised him that he is not on any medicines to lower his heart rate, just keep an eye on it.  2. Palpitations: Bradley Zhang had some PVCs but no sustained arrhythmia or tachycardia. He was not able to remember the exact date that his wife told him his heart rate was 175 but he was reassured that he had no life-threatening arrhythmias and no medication changes were indicated at this time.  Advised him to keep an eye on his symptoms and call us if they worsen.  Current medicines are reviewed at length with the patient today.  The patient does not have concerns regarding medicines.  The following changes have been made:  no change  Labs/ tests ordered today include:   Orders Placed This Encounter  Procedures  . EKG 12-Lead   Disposition:   FU with Dr. Shirlee Latch in one year   Signed, Leanna Battles  04/03/2015 10:55 AM    [facility] [address] [place], [place], [place]  [address] Phone: [phone]; Fax: [phone]

## 2015-06-10 DIAGNOSIS — K625 Hemorrhage of anus and rectum: Secondary | ICD-10-CM | POA: Insufficient documentation

## 2015-09-16 ENCOUNTER — Encounter (HOSPITAL_COMMUNITY): Payer: Self-pay | Admitting: Emergency Medicine

## 2015-09-16 ENCOUNTER — Emergency Department (HOSPITAL_COMMUNITY)
Admission: EM | Admit: 2015-09-16 | Discharge: 2015-09-16 | Disposition: A | Discharge status: Home / Self Care | Payer: Managed Care, Other (non HMO) | Attending: Emergency Medicine | Admitting: Emergency Medicine

## 2015-09-16 ENCOUNTER — Emergency Department (HOSPITAL_COMMUNITY)
Admission: EM | Admit: 2015-09-16 | Discharge: 2015-09-16 | Disposition: A | Discharge status: Home / Self Care | Payer: Managed Care, Other (non HMO) | Source: Home / Self Care

## 2015-09-16 ENCOUNTER — Emergency Department (HOSPITAL_COMMUNITY): Payer: Managed Care, Other (non HMO)

## 2015-09-16 DIAGNOSIS — R079 Chest pain, unspecified: Secondary | ICD-10-CM | POA: Insufficient documentation

## 2015-09-16 DIAGNOSIS — M79662 Pain in left lower leg: Secondary | ICD-10-CM | POA: Insufficient documentation

## 2015-09-16 DIAGNOSIS — R0602 Shortness of breath: Secondary | ICD-10-CM

## 2015-09-16 DIAGNOSIS — M549 Dorsalgia, unspecified: Secondary | ICD-10-CM

## 2015-09-16 DIAGNOSIS — R110 Nausea: Secondary | ICD-10-CM

## 2015-09-16 DIAGNOSIS — R5383 Other fatigue: Secondary | ICD-10-CM | POA: Insufficient documentation

## 2015-09-16 DIAGNOSIS — R51 Headache: Secondary | ICD-10-CM

## 2015-09-16 LAB — CBC
HEMATOCRIT: 43.6 % (ref 39.0–52.0)
HEMOGLOBIN: 14.4 g/dL (ref 13.0–17.0)
MCH: 31.8 pg (ref 26.0–34.0)
MCHC: 33 g/dL (ref 30.0–36.0)
MCV: 96.2 fL (ref 78.0–100.0)
Platelets: 258 10*3/uL (ref 150–400)
RBC: 4.53 MIL/uL (ref 4.22–5.81)
RDW: 13.5 % (ref 11.5–15.5)
WBC: 7.9 10*3/uL (ref 4.0–10.5)

## 2015-09-16 LAB — BASIC METABOLIC PANEL
Anion gap: 12 (ref 5–15)
BUN: 9 mg/dL (ref 6–20)
CHLORIDE: 107 mmol/L (ref 101–111)
CO2: 22 mmol/L (ref 22–32)
CREATININE: 0.84 mg/dL (ref 0.61–1.24)
Calcium: 9.7 mg/dL (ref 8.9–10.3)
GFR calc non Af Amer: >60 mL/min (ref >60)
GLUCOSE: 99 mg/dL (ref 65–99)
Potassium: 4.4 mmol/L (ref 3.5–5.1)
Sodium: 141 mmol/L (ref 135–145)

## 2015-09-16 LAB — COMPREHENSIVE METABOLIC PANEL
ALBUMIN: 4.4 g/dL (ref 3.5–5.0)
ALK PHOS: 65 U/L (ref 38–126)
ALT: 15 U/L — ABNORMAL LOW (ref 17–63)
ANION GAP: 9 (ref 5–15)
AST: 14 U/L — AB (ref 15–41)
BUN: 11 mg/dL (ref 6–20)
CALCIUM: 9.5 mg/dL (ref 8.9–10.3)
CO2: 26 mmol/L (ref 22–32)
Chloride: 106 mmol/L (ref 101–111)
Creatinine, Ser: 0.78 mg/dL (ref 0.61–1.24)
GFR calc Af Amer: >60 mL/min (ref >60)
GFR calc non Af Amer: >60 mL/min (ref >60)
GLUCOSE: 93 mg/dL (ref 65–99)
Potassium: 4.6 mmol/L (ref 3.5–5.1)
SODIUM: 141 mmol/L (ref 135–145)
Total Bilirubin: 0.7 mg/dL (ref 0.3–1.2)
Total Protein: 7.5 g/dL (ref 6.5–8.1)

## 2015-09-16 LAB — I-STAT TROPONIN, ED: Troponin i, poc: 0 ng/mL (ref 0.00–0.08)

## 2015-09-16 MED ORDER — OXYCODONE-ACETAMINOPHEN 5-325 MG PO TABS
1.0000 | ORAL_TABLET | Freq: Once | ORAL | Status: AC
  Administered 2015-09-16: 1 via ORAL
  Filled 2015-09-16: qty 1

## 2015-09-16 MED ORDER — ONDANSETRON 4 MG PO TBDP
4.0000 mg | ORAL_TABLET | Freq: Once | ORAL | Status: AC | PRN
  Administered 2015-09-16: 4 mg via ORAL
  Filled 2015-09-16: qty 1

## 2015-09-16 NOTE — ED Notes (Signed)
Pain medication given in Triage. Patient advised about side effects of medications and  to avoid driving for a minimum of 4 hours.  

## 2015-09-16 NOTE — ED Notes (Signed)
Pt states that he was at [facility] but was not seen.  States he had an EKG and labwork.  Pt states that he has been having fatigue and lt sided CP with exertional SOB.  States that 1130am today, things got worse.  Now complaining of worsening headache, chest pain, and "fading in and out".  Family is very concerned, at bedside and does not want pt to wait, trying to convince this nurse that he needs to go back immediately because "sometimes his heartrate drops into the 30s.".  Denies any vomiting but endorses nausea.

## 2015-09-16 NOTE — ED Notes (Signed)
Patient's family asked if we could call an ambulance to take patient to another hospital.   Spoke with charge RN, and the answer is no.   Patient had already been triaged with blood back, but the family did not want to stay.   Patient family states "what if he dies from this heart attack".  They left.

## 2015-09-16 NOTE — ED Notes (Signed)
Pt states "i just got done at the PCP office for consultation. I walked down the stairs , started having pain in my Chest, pressure, and pain in my left calf. I feel like im going to throw up any second." Pt has collapsed disk in C6-7, at pcp for consulation for neck surgery to repair. Pt in NAD at this time.

## 2015-09-16 NOTE — ED Notes (Signed)
Pt decided not to wait.  Notified staff they were seeing a cardiologist soon and would follow up with him.

## 2016-02-29 DIAGNOSIS — M7702 Medial epicondylitis, left elbow: Secondary | ICD-10-CM | POA: Insufficient documentation

## 2016-02-29 DIAGNOSIS — M7701 Medial epicondylitis, right elbow: Secondary | ICD-10-CM | POA: Insufficient documentation

## 2016-03-19 DIAGNOSIS — F17210 Nicotine dependence, cigarettes, uncomplicated: Secondary | ICD-10-CM | POA: Insufficient documentation

## 2016-07-29 DIAGNOSIS — F411 Generalized anxiety disorder: Secondary | ICD-10-CM | POA: Insufficient documentation

## 2016-10-06 DIAGNOSIS — F3342 Major depressive disorder, recurrent, in full remission: Secondary | ICD-10-CM | POA: Insufficient documentation

## 2016-10-06 DIAGNOSIS — E559 Vitamin D deficiency, unspecified: Secondary | ICD-10-CM | POA: Insufficient documentation

## 2016-11-23 ENCOUNTER — Other Ambulatory Visit: Payer: Self-pay | Admitting: Physical Medicine and Rehabilitation

## 2016-11-23 DIAGNOSIS — M50323 Other cervical disc degeneration at C6-C7 level: Secondary | ICD-10-CM

## 2016-11-25 ENCOUNTER — Inpatient Hospital Stay
Admission: RE | Admit: 2016-11-25 | Discharge: 2016-11-25 | Disposition: A | Discharge status: Home / Self Care | Payer: Managed Care, Other (non HMO) | Source: Ambulatory Visit | Attending: Physical Medicine and Rehabilitation | Admitting: Physical Medicine and Rehabilitation

## 2016-11-25 ENCOUNTER — Other Ambulatory Visit: Payer: Managed Care, Other (non HMO)

## 2023-05-12 ENCOUNTER — Other Ambulatory Visit: Payer: Self-pay | Admitting: Podiatry

## 2023-05-12 ENCOUNTER — Ambulatory Visit: Payer: BLUE CROSS/BLUE SHIELD

## 2023-05-12 ENCOUNTER — Ambulatory Visit: Payer: Self-pay | Admitting: Podiatry

## 2023-05-12 DIAGNOSIS — M722 Plantar fascial fibromatosis: Secondary | ICD-10-CM | POA: Diagnosis not present

## 2023-05-12 DIAGNOSIS — M19072 Primary osteoarthritis, left ankle and foot: Secondary | ICD-10-CM

## 2023-05-12 DIAGNOSIS — M79672 Pain in left foot: Secondary | ICD-10-CM

## 2023-05-12 DIAGNOSIS — M7732 Calcaneal spur, left foot: Secondary | ICD-10-CM | POA: Diagnosis not present

## 2023-05-12 MED ORDER — MELOXICAM 15 MG PO TABS: 15.0000 mg | ORAL_TABLET | Freq: Every day | ORAL | 0 refills | Status: AC

## 2023-05-12 MED ORDER — TRIAMCINOLONE ACETONIDE 10 MG/ML IJ SUSP
2.5000 mg | Freq: Once | INTRAMUSCULAR | Status: AC
  Administered 2023-05-12: 2.5 mg via INTRA_ARTICULAR

## 2023-05-12 NOTE — Progress Notes (Signed)
  Subjective:  Patient ID: Bradley Zhang, male    DOB: January 22, 1976,   MRN: [id number]  Chief Complaint  Patient presents with   Foot Pain    LEFT HEEL PAIN, HAS BEEN HAVING THE PAIN FOR ABOUT 2 + MONTHS, BEEN DOING EXERCISE AT HOME, BOUGHT SOMETHING LIKE A NIGHTSPLINT ON AMAZON BUT IT WASN'T COMFORTABLE TO WEAR     47 y.o. male presents for concern as above. Relates pain mostly in the mornings or after rest or after on his feet for a while during the day. Has tried some stretching and massaging but not doing enough.  . Denies any other pedal complaints. Denies n/v/f/c.   Past Medical History:  Diagnosis Date   Anemia    Anxiety    Crohn disease (HCC)    Diverticulosis    Sinus bradycardia     Objective:  Physical Exam: Vascular: DP/PT pulses 2/4 bilateral. CFT <3 seconds. Normal hair growth on digits. No edema.  Skin. No lacerations or abrasions bilateral feet.  Musculoskeletal: MMT 5/5 bilateral lower extremities in DF, PF, Inversion and Eversion. Deceased ROM in DF of ankle joint. Tender to the medial calcaneal tubercle left . No pain with achilles, PT or arch. No pain with calcaneal squeeze.  Neurological: Sensation intact to light touch.   Assessment:   1. Plantar fasciitis, left      Plan:  Patient was evaluated and treated and all questions answered. Discussed plantar fasciitis with patient.  X-rays reviewed and discussed with patient. No acute fractures or dislocations noted. Mild spurring noted at inferior calcaneus.  Discussed treatment options including, ice, NSAIDS, supportive shoes, bracing, and stretching. Stretching exercises provided to be done on a daily basis.   Prescription for meloxicam provided and sent to pharmacy. Kidney function labs wnl.  Patient requesting injection today. Procedure note below.   PF brace dispensed.  Follow-up 6 weeks or sooner if any problems arise. In the meantime, encouraged to call the office with any questions, concerns,  change in symptoms.   Procedure:  Discussed etiology, pathology, conservative vs. surgical therapies. At this time a plantar fascial injection was recommended.  The patient agreed and a sterile skin prep was applied.  An injection consisting of  1cc dexamethasone 0.5 cc kenalog and 1cc marcaine mixture was infiltrated at the point of maximal tenderness on the left Heel.  Bandaid applied. The patient tolerated this well and was given instructions for aftercare.    Louann Sjogren, DPM

## 2023-05-12 NOTE — Patient Instructions (Signed)

## 2023-06-24 ENCOUNTER — Ambulatory Visit (INDEPENDENT_AMBULATORY_CARE_PROVIDER_SITE_OTHER): Payer: BLUE CROSS/BLUE SHIELD | Admitting: Podiatry

## 2023-06-24 DIAGNOSIS — Z91199 Patient's noncompliance with other medical treatment and regimen due to unspecified reason: Secondary | ICD-10-CM

## 2023-06-24 NOTE — Progress Notes (Signed)
No show
